# Patient Record
Sex: Male | Born: 1945
Health system: Southern US, Community
[De-identification: ages and names within clinical notes are randomized; demographics above are authoritative.]

## PROBLEM LIST (undated history)

## (undated) DIAGNOSIS — Z9889 Other specified postprocedural states: Secondary | ICD-10-CM

## (undated) DIAGNOSIS — Z87442 Personal history of urinary calculi: Secondary | ICD-10-CM

## (undated) DIAGNOSIS — N402 Nodular prostate without lower urinary tract symptoms: Secondary | ICD-10-CM

## (undated) DIAGNOSIS — T7840XA Allergy, unspecified, initial encounter: Secondary | ICD-10-CM

## (undated) DIAGNOSIS — N400 Enlarged prostate without lower urinary tract symptoms: Secondary | ICD-10-CM

## (undated) DIAGNOSIS — K219 Gastro-esophageal reflux disease without esophagitis: Secondary | ICD-10-CM

## (undated) DIAGNOSIS — N281 Cyst of kidney, acquired: Secondary | ICD-10-CM

## (undated) DIAGNOSIS — C679 Malignant neoplasm of bladder, unspecified: Secondary | ICD-10-CM

## (undated) DIAGNOSIS — E785 Hyperlipidemia, unspecified: Secondary | ICD-10-CM

## (undated) DIAGNOSIS — R351 Nocturia: Secondary | ICD-10-CM

## (undated) DIAGNOSIS — B019 Varicella without complication: Secondary | ICD-10-CM

## (undated) DIAGNOSIS — Z85828 Personal history of other malignant neoplasm of skin: Secondary | ICD-10-CM

## (undated) DIAGNOSIS — E291 Testicular hypofunction: Secondary | ICD-10-CM

## (undated) DIAGNOSIS — N200 Calculus of kidney: Secondary | ICD-10-CM

## (undated) HISTORY — PX: HEMORRHOID BANDING: SHX5850

## (undated) HISTORY — DX: Allergy, unspecified, initial encounter: T78.40XA

## (undated) HISTORY — DX: Gastro-esophageal reflux disease without esophagitis: K21.9

## (undated) HISTORY — PX: TONSILLECTOMY: SUR1361

## (undated) HISTORY — DX: Varicella without complication: B01.9

---

## 1989-06-18 HISTORY — PX: LASIK: SHX215

## 2002-08-31 ENCOUNTER — Encounter (INDEPENDENT_AMBULATORY_CARE_PROVIDER_SITE_OTHER): Payer: Self-pay | Admitting: Specialist

## 2002-08-31 ENCOUNTER — Ambulatory Visit (HOSPITAL_COMMUNITY): Admission: RE | Admit: 2002-08-31 | Discharge: 2002-08-31 | Payer: Self-pay | Admitting: Gastroenterology

## 2003-10-19 HISTORY — PX: OTHER SURGICAL HISTORY: SHX169

## 2005-11-25 ENCOUNTER — Ambulatory Visit: Payer: Self-pay | Admitting: Internal Medicine

## 2005-12-24 ENCOUNTER — Ambulatory Visit: Payer: Self-pay | Admitting: Internal Medicine

## 2009-02-22 ENCOUNTER — Emergency Department (HOSPITAL_COMMUNITY): Admission: EM | Admit: 2009-02-22 | Discharge: 2009-02-22 | Payer: Self-pay | Admitting: Emergency Medicine

## 2009-02-23 ENCOUNTER — Observation Stay (HOSPITAL_COMMUNITY): Admission: EM | Admit: 2009-02-23 | Discharge: 2009-02-25 | Payer: Self-pay | Admitting: Emergency Medicine

## 2009-02-24 ENCOUNTER — Encounter (INDEPENDENT_AMBULATORY_CARE_PROVIDER_SITE_OTHER): Payer: Self-pay | Admitting: Surgery

## 2009-02-24 HISTORY — PX: LAPAROSCOPIC CHOLECYSTECTOMY: SUR755

## 2011-01-26 LAB — CBC
HCT: 35.3 % — ABNORMAL LOW (ref 39.0–52.0)
HCT: 37.8 % — ABNORMAL LOW (ref 39.0–52.0)
HCT: 41.5 % (ref 39.0–52.0)
Hemoglobin: 12.2 g/dL — ABNORMAL LOW (ref 13.0–17.0)
Hemoglobin: 13.1 g/dL (ref 13.0–17.0)
Hemoglobin: 14.6 g/dL (ref 13.0–17.0)
MCHC: 34.8 g/dL (ref 30.0–36.0)
MCHC: 35.1 g/dL (ref 30.0–36.0)
MCV: 93 fL (ref 78.0–100.0)
MCV: 93 fL (ref 78.0–100.0)
Platelets: 185 K/uL (ref 150–400)
RBC: 4.07 MIL/uL — ABNORMAL LOW (ref 4.22–5.81)
RBC: 4.48 MIL/uL (ref 4.22–5.81)
RDW: 12.9 % (ref 11.5–15.5)
RDW: 13.2 % (ref 11.5–15.5)
WBC: 14.4 10*3/uL — ABNORMAL HIGH (ref 4.0–10.5)
WBC: 16.1 K/uL — ABNORMAL HIGH (ref 4.0–10.5)

## 2011-01-26 LAB — DIFFERENTIAL
Basophils Absolute: 0 10*3/uL (ref 0.0–0.1)
Basophils Absolute: 0.1 K/uL (ref 0.0–0.1)
Basophils Relative: 1 % (ref 0–1)
Eosinophils Absolute: 0 10*3/uL (ref 0.0–0.7)
Eosinophils Relative: 0 % (ref 0–5)
Eosinophils Relative: 0 % (ref 0–5)
Lymphocytes Relative: 13 % (ref 12–46)
Lymphocytes Relative: 7 % — ABNORMAL LOW (ref 12–46)
Lymphs Abs: 1.1 10*3/uL (ref 0.7–4.0)
Monocytes Absolute: 0.2 10*3/uL (ref 0.1–1.0)
Monocytes Absolute: 1.2 K/uL — ABNORMAL HIGH (ref 0.1–1.0)
Monocytes Relative: 3 % (ref 3–12)
Monocytes Relative: 8 % (ref 3–12)
Neutro Abs: 13.6 K/uL — ABNORMAL HIGH (ref 1.7–7.7)
Neutrophils Relative %: 85 % — ABNORMAL HIGH (ref 43–77)

## 2011-01-26 LAB — COMPREHENSIVE METABOLIC PANEL WITH GFR
Albumin: 3.5 g/dL (ref 3.5–5.2)
Alkaline Phosphatase: 56 U/L (ref 39–117)
BUN: 16 mg/dL (ref 6–23)
Chloride: 103 meq/L (ref 96–112)
Glucose, Bld: 120 mg/dL — ABNORMAL HIGH (ref 70–99)
Potassium: 3.7 meq/L (ref 3.5–5.1)
Total Bilirubin: 0.9 mg/dL (ref 0.3–1.2)

## 2011-01-26 LAB — COMPREHENSIVE METABOLIC PANEL
ALT: 21 U/L (ref 0–53)
AST: 24 U/L (ref 0–37)
CO2: 27 mEq/L (ref 19–32)
Calcium: 9.9 mg/dL (ref 8.4–10.5)
Creatinine, Ser: 0.96 mg/dL (ref 0.4–1.5)
GFR calc Af Amer: >60 mL/min (ref >60)
GFR calc non Af Amer: >60 mL/min (ref >60)
Sodium: 133 mEq/L — ABNORMAL LOW (ref 135–145)
Total Protein: 5.9 g/dL — ABNORMAL LOW (ref 6.0–8.3)

## 2011-01-26 LAB — BASIC METABOLIC PANEL
CO2: 22 mEq/L (ref 19–32)
GFR calc Af Amer: >60 mL/min (ref >60)
GFR calc non Af Amer: >60 mL/min (ref >60)
Glucose, Bld: 132 mg/dL — ABNORMAL HIGH (ref 70–99)
Potassium: 3.7 mEq/L (ref 3.5–5.1)
Sodium: 138 mEq/L (ref 135–145)

## 2011-01-26 LAB — CARDIAC PANEL(CRET KIN+CKTOT+MB+TROPI)
CK, MB: 4 ng/mL (ref 0.3–4.0)
Relative Index: 2 (ref 0.0–2.5)

## 2011-01-26 LAB — HEPATIC FUNCTION PANEL
ALT: 30 U/L (ref 0–53)
AST: 36 U/L (ref 0–37)
Albumin: 4.2 g/dL (ref 3.5–5.2)
Bilirubin, Direct: <0.1 mg/dL (ref 0.0–0.3)

## 2011-01-26 LAB — LIPASE, BLOOD: Lipase: 18 U/L (ref 11–59)

## 2011-03-02 NOTE — Discharge Summary (Signed)
NAMEPANAYIOTIS, RAINVILLE             ACCOUNT NO.:  [id number]   MEDICAL RECORD NO.:  [id number]          PATIENT TYPE:  INP   LOCATION:  1526                         FACILITY:  [facility]   PHYSICIAN:  Clovis Pu. Cornett, M.D.DATE OF BIRTH:  October 02, 1946   DATE OF ADMISSION:  02/23/2009  DATE OF DISCHARGE:  02/25/2009                               DISCHARGE SUMMARY   ADMITTING DIAGNOSIS:  Acute cholecystitis.   DISCHARGE DIAGNOSIS:  Gangrenous cholecystitis.   PROCEDURE:  Laparoscopic cholecystectomy with cholangiogram.   BRIEF HISTORY:  The patient is a 65 year old male admitted on Feb 23, 2009, with acute cholecystitis.  On Feb 24, 2009, he went to the  operating room for laparoscopic cholecystectomy.   HOSPITAL COURSE:  Please see op note for details.  The patient was  admitted on Feb 23, 2009.  He underwent surgery on Feb 24, 2009, which  consisted of a laparoscopic cholecystectomy with cholangiogram.  He had  gangrenous cholecystitis and a drain was placed.  On postoperative day  #1, he is doing well.  He had no fever or chills.  He was walking the  halls without difficulty and tolerating liquids.  His wounds were clean,  dry, and intact.  His JP output was serous.  He was discharged home on  postop day 1 in satisfactory condition.   DISCHARGE INSTRUCTIONS:  He will follow up next week to have his JP  drain removed in my office.  He will be maintained on Levaquin 500 mg  p.o. daily.  He will be given a script for Vicodin 1 to 2 tabs q.4  p.r.n. pain every 4 hours as needed and follow up next week.  He may  shower.  He will refrain from heavy lifting and driving for 1 week.   CONDITION AT DISCHARGE:  Improved.   DISCHARGE MEDICATIONS:  1. Gemfibrozil 1 tablet daily.  2. Simvastatin 20 mg daily.  3. Pantoprazole 40 mg daily.  4. Metoclopramide 10 mg every 6 hours.  5. Oxycodone as needed.  6. Vicodin 1 or 2 tabs q.4 p.r.n. pain.  7. Levaquin 500 mg p.o. daily.      Thomas A.  Cornett, M.D.  Electronically Signed     TAC/MEDQ  D:  02/25/2009  T:  02/25/2009  Job:  [id number]

## 2011-03-02 NOTE — Op Note (Signed)
Jeffrey Horn, Jeffrey Horn             ACCOUNT NO.:  [id number]   MEDICAL RECORD NO.:  [id number]          PATIENT TYPE:  INP   LOCATION:  1526                         FACILITY:  [facility]   PHYSICIAN:  Clovis Pu. Cornett, M.D.DATE OF BIRTH:  1946/03/23   DATE OF PROCEDURE:  02/24/2009  DATE OF DISCHARGE:                               OPERATIVE REPORT   PREOPERATIVE DIAGNOSIS:  Acute cholecystitis.   POSTOPERATIVE DIAGNOSIS:  Gangrenous cholecystitis.   PROCEDURE:  Laparoscopic cholecystectomy with intraoperative  cholangiogram.   SURGEON:  Dr. Harriette Bouillon.   ASSISTANT:  Dr. Cyndia Bent.   ANESTHESIA:  General endotracheal anesthesia.   ESTIMATED BLOOD LOSS:  100 mL.   DRAIN:  A 19 Blake drain to gallbladder fossa.   SPECIMEN:  Gallbladder with gallstones to pathology.   INDICATIONS FOR PROCEDURE:  The patient is a 65 year old male admitted  last night Dr. Bertram Savin due to abdominal pain and he was found to  have acute cholecystitis.  I was asked to see him today since she was  unavailable for laparoscopic cholecystectomy.  I saw the patient and  discussed the procedure with him in the area.  I described the  complications of bleeding, infection, common duct injury and injury to  the organs.  He understood the above complications.  He understood the  need for surgery and agreed to proceed.   DESCRIPTION OF PROCEDURE:  The patient was brought to the operating room  and placed supine.  After induction of general anesthesia, the abdomen  was prepped and draped in a sterile fashion.  A 1-cm infraumbilical  incision was made.  Dissection was carried down to his fascia.  His  fascia was in the midline with a scalpel.  Kocher clamps were used and I  used my finger to push through the peritoneal lining into the abdominal  cavity.  A pursestring suture of 0 Vicryl was placed and a 12-mm port  was placed under direct vision.  Pneumoperitoneum was created to 15 mmHg  of CO2.   Laparoscope was placed.  He was placed in reversed  Trendelenburg and rolled to his left.  Upon inspection, there was  significant chronic changes around the gallbladder.  An 11-mm subxiphoid  port was placed under direct vision.  Two 5 mm ports were placed, one in  the mid right abdomen and the other in the right lower quadrant.  These  were placed under direct vision.  The gallbladder was identified.  The  omentum was adherent to it.  Upon cutting the omentum away, the  gallbladder showed signs of gangrene.  The gallbladder was grabbed by  its dome.  We then were able to push the omentum away to expose the  infundibulum.  A second grasper was used to grab this.  The dissection  was slowly teased.  We were able to identify the cystic duct entering  the gallbladder and the cystic artery as well.  I went ahead and divided  the cystic artery to better open up the critical angle which I did.  We  then put clips on the gallbladder side of the  cystic duct and made a  small incision in it.  Through a separate stab wound, a Cook  cholangiogram catheter was then placed and held in place by clips.  Intraoperative cholangiogram using fluoroscopy and one half-strength  Hypaque dye was used which showed free flow of contrast down the cystic  duct into the common duct and duodenum.  Contrast then went up the  common hepatic duct into right and left hepatic ducts.  There was some  extravasation from around the clip, but we saw no evidence of stone,  stricture or any obstruction.  At this point, I removed the catheter.  We then triple clipped the cystic duct and divided it.  We then used  cautery.  We used clips on any small branches of the posterior cystic  artery we saw to divide.  The gallbladder was decompressed prior to  this.  We then finished our dissection, removing it from the gallbladder  bed and it was partially intrahepatic.  We then placed in an EndoCatch  bag.  The gallbladder bed was  irrigated.  Bleeding was controlled with  Surgicel.  Through a separate stab incision a 19 Blake drain was placed  in the gallbladder bed since it was quite oozy and inflamed.  This was  brought out through the most right lateral port site and secured to the  skin with a 3-0 nylon.  On final inspection, there was no bleeding in  the gallbladder bed.  We then removed the gallbladder through the  umbilical port and passed it off the field.  We suctioned out any excess  irrigation.  We then removed our ports under direct vision and allowed  the CO2 to escape.  The skin was closed with 4-0 Monocryl and Dermabond.  All final counts of sponges, needles and instruments were found be  correct at this portion of the case.  The patient was then awakened,  extubated and taken to recovery in satisfactory condition.      Thomas A. Cornett, M.D.  Electronically Signed     TAC/MEDQ  D:  02/24/2009  T:  02/24/2009  Job:  [id number]

## 2011-03-02 NOTE — H&P (Signed)
NAMELIONARDO, HAZE             ACCOUNT NO.:  [id number]   MEDICAL RECORD NO.:  [id number]          PATIENT TYPE:  INP   LOCATION:  1526                         FACILITY:  [facility]   PHYSICIAN:  Lennie Muckle, MD      DATE OF BIRTH:  [birth year]   DATE OF ADMISSION:  02/23/2009  DATE OF DISCHARGE:                              HISTORY & PHYSICAL   CHIEF COMPLAINT:  Abdominal pain.   HISTORY OF PRESENT ILLNESS:  Mr. Eakins is a 65 year old male who  apparently came to the emergency department yesterday for onset of  abdominal pain at 3 a.m.  He was evaluated and was diagnosed with reflux  disease and sent home with protonix , Oxycodone, and Myclopramide.  He  states the pain worsened over the course of the day.  He began having  mild nausea.  He had associated fever of 100.6.  He did have chills.  The pain was worse with movement and deep inspiration and cough.  Due to  continued pain, he came back to the emergency department.  He did have  an ultrasound which showed a stone in the neck of the gallbladder.  He  had mild thickened wall, no pericholecystic fluid, and some sludge was  also seen.  He states he has had not had any history of prior.  No  diarrhea.  No history of jaundice.   PAST MEDICAL HISTORY:  Hypercholesterolemia.   FAMILY HISTORY:  Lung cancer.   SOCIAL HISTORY:  No tobacco or alcohol use.  He is married.  He works at  home.  He does exercise.  He runs almost every other day.   ALLERGIES:  No drug allergies.   MEDICATIONS:  Gemfibrozil, simvastatin, Oxycodone.   REVIEW OF SYSTEMS:  Negative.   PHYSICAL EXAMINATION:  GENERAL:  He is a pleasant male, appears his  stated age.  Overall he appears well-developed, well-nourished, in no  acute distress.  VITAL SIGNS:  Temperature 98.4, pulse 78, blood pressure 108/72.  HEENT:  Head is normocephalic.  Sclerae clear.  NECK:  Supple.  No lymphadenopathy.  CHEST:  Clear to auscultation bilaterally.  CARDIOVASCULAR:   Regular rate and rhythm.  ABDOMEN:  Soft.  He is mildly tender to epigastric region.  No  peritoneal signs.  No masses, no organomegaly  SKIN:  No jaundices, no rashes.  EXTREMITIES/MUSCULOSKELETAL:  No deformities or edema.  NEUROLOGIC:  I see no abnormalities.  PSYCHOLOGIC:  Normal.   LABORATORY DATA:  White count is elevated at 15.9, hemoglobin 13.  Serum  chemistries normal.  Liver enzymes normal.  Alkaline phosphatase 56, ALT  24, AST 21.   IMPRESSION/PLAN:  Acute cholecystitis/cholelithiasis.   PLAN:  Admit with IV antibiotics of Zosyn.  Continue his n.p.o. status  Give him Dilaudid for pain control and plan on performing laparoscopic  cholecystectomy in the morning.  The risks of the surgery were explained  to the patient and his family who was present in the room.  All  questions were answered.  He will be monitored tonight.  Repeat CBC in  the morning.  Lennie Muckle, MD  Electronically Signed     ALA/MEDQ  D:  02/23/2009  T:  02/24/2009  Job:  [id number]   cc:   Caryn Bee L. Little, M.D.  Fax: [phone]

## 2011-11-01 DIAGNOSIS — Z125 Encounter for screening for malignant neoplasm of prostate: Secondary | ICD-10-CM | POA: Diagnosis not present

## 2011-11-01 DIAGNOSIS — E7800 Pure hypercholesterolemia, unspecified: Secondary | ICD-10-CM | POA: Diagnosis not present

## 2011-11-01 DIAGNOSIS — Z79899 Other long term (current) drug therapy: Secondary | ICD-10-CM | POA: Diagnosis not present

## 2011-11-01 DIAGNOSIS — E291 Testicular hypofunction: Secondary | ICD-10-CM | POA: Diagnosis not present

## 2011-11-01 DIAGNOSIS — Z0000 Encounter for general adult medical examination without abnormal findings: Secondary | ICD-10-CM | POA: Diagnosis not present

## 2011-11-01 DIAGNOSIS — IMO0001 Reserved for inherently not codable concepts without codable children: Secondary | ICD-10-CM | POA: Diagnosis not present

## 2011-11-01 DIAGNOSIS — L570 Actinic keratosis: Secondary | ICD-10-CM | POA: Diagnosis not present

## 2011-11-01 DIAGNOSIS — N200 Calculus of kidney: Secondary | ICD-10-CM | POA: Diagnosis not present

## 2012-07-06 DIAGNOSIS — Z23 Encounter for immunization: Secondary | ICD-10-CM | POA: Diagnosis not present

## 2012-08-01 DIAGNOSIS — Z0000 Encounter for general adult medical examination without abnormal findings: Secondary | ICD-10-CM | POA: Diagnosis not present

## 2012-08-01 DIAGNOSIS — N402 Nodular prostate without lower urinary tract symptoms: Secondary | ICD-10-CM | POA: Diagnosis not present

## 2012-08-01 DIAGNOSIS — N200 Calculus of kidney: Secondary | ICD-10-CM | POA: Diagnosis not present

## 2012-08-01 DIAGNOSIS — Z79899 Other long term (current) drug therapy: Secondary | ICD-10-CM | POA: Diagnosis not present

## 2012-08-01 DIAGNOSIS — Z125 Encounter for screening for malignant neoplasm of prostate: Secondary | ICD-10-CM | POA: Diagnosis not present

## 2012-08-01 DIAGNOSIS — E291 Testicular hypofunction: Secondary | ICD-10-CM | POA: Diagnosis not present

## 2012-08-01 DIAGNOSIS — E7800 Pure hypercholesterolemia, unspecified: Secondary | ICD-10-CM | POA: Diagnosis not present

## 2012-08-01 DIAGNOSIS — IMO0001 Reserved for inherently not codable concepts without codable children: Secondary | ICD-10-CM | POA: Diagnosis not present

## 2012-11-09 DIAGNOSIS — Z79899 Other long term (current) drug therapy: Secondary | ICD-10-CM | POA: Diagnosis not present

## 2012-11-09 DIAGNOSIS — Z0000 Encounter for general adult medical examination without abnormal findings: Secondary | ICD-10-CM | POA: Diagnosis not present

## 2012-11-09 DIAGNOSIS — Z125 Encounter for screening for malignant neoplasm of prostate: Secondary | ICD-10-CM | POA: Diagnosis not present

## 2012-11-09 DIAGNOSIS — Z1331 Encounter for screening for depression: Secondary | ICD-10-CM | POA: Diagnosis not present

## 2012-11-09 DIAGNOSIS — N200 Calculus of kidney: Secondary | ICD-10-CM | POA: Diagnosis not present

## 2012-11-09 DIAGNOSIS — E7800 Pure hypercholesterolemia, unspecified: Secondary | ICD-10-CM | POA: Diagnosis not present

## 2012-11-09 DIAGNOSIS — L570 Actinic keratosis: Secondary | ICD-10-CM | POA: Diagnosis not present

## 2012-11-09 DIAGNOSIS — E291 Testicular hypofunction: Secondary | ICD-10-CM | POA: Diagnosis not present

## 2012-12-11 DIAGNOSIS — Z1211 Encounter for screening for malignant neoplasm of colon: Secondary | ICD-10-CM | POA: Diagnosis not present

## 2012-12-11 DIAGNOSIS — K648 Other hemorrhoids: Secondary | ICD-10-CM | POA: Diagnosis not present

## 2012-12-27 DIAGNOSIS — K648 Other hemorrhoids: Secondary | ICD-10-CM | POA: Diagnosis not present

## 2013-01-10 DIAGNOSIS — K648 Other hemorrhoids: Secondary | ICD-10-CM | POA: Diagnosis not present

## 2013-01-24 DIAGNOSIS — K648 Other hemorrhoids: Secondary | ICD-10-CM | POA: Diagnosis not present

## 2013-05-08 ENCOUNTER — Other Ambulatory Visit: Payer: Self-pay | Admitting: Dermatology

## 2013-05-08 DIAGNOSIS — L821 Other seborrheic keratosis: Secondary | ICD-10-CM | POA: Diagnosis not present

## 2013-05-08 DIAGNOSIS — C44621 Squamous cell carcinoma of skin of unspecified upper limb, including shoulder: Secondary | ICD-10-CM | POA: Diagnosis not present

## 2013-05-08 DIAGNOSIS — L819 Disorder of pigmentation, unspecified: Secondary | ICD-10-CM | POA: Diagnosis not present

## 2013-05-08 DIAGNOSIS — D235 Other benign neoplasm of skin of trunk: Secondary | ICD-10-CM | POA: Diagnosis not present

## 2013-05-08 DIAGNOSIS — L570 Actinic keratosis: Secondary | ICD-10-CM | POA: Diagnosis not present

## 2013-06-19 DIAGNOSIS — Z85828 Personal history of other malignant neoplasm of skin: Secondary | ICD-10-CM | POA: Diagnosis not present

## 2013-06-28 DIAGNOSIS — Z23 Encounter for immunization: Secondary | ICD-10-CM | POA: Diagnosis not present

## 2013-08-02 DIAGNOSIS — E7800 Pure hypercholesterolemia, unspecified: Secondary | ICD-10-CM | POA: Diagnosis not present

## 2013-09-17 DIAGNOSIS — Z85828 Personal history of other malignant neoplasm of skin: Secondary | ICD-10-CM | POA: Diagnosis not present

## 2013-10-05 ENCOUNTER — Other Ambulatory Visit: Payer: Self-pay | Admitting: Family Medicine

## 2013-10-05 ENCOUNTER — Ambulatory Visit
Admission: RE | Admit: 2013-10-05 | Discharge: 2013-10-05 | Disposition: A | Discharge status: Home / Self Care | Payer: Medicare Other | Source: Ambulatory Visit | Attending: Family Medicine | Admitting: Family Medicine

## 2013-10-05 DIAGNOSIS — N200 Calculus of kidney: Secondary | ICD-10-CM | POA: Diagnosis not present

## 2013-10-05 DIAGNOSIS — R319 Hematuria, unspecified: Secondary | ICD-10-CM | POA: Diagnosis not present

## 2013-10-30 DIAGNOSIS — N281 Cyst of kidney, acquired: Secondary | ICD-10-CM | POA: Diagnosis not present

## 2013-10-30 DIAGNOSIS — N200 Calculus of kidney: Secondary | ICD-10-CM | POA: Diagnosis not present

## 2013-10-30 DIAGNOSIS — R310 Gross hematuria: Secondary | ICD-10-CM | POA: Diagnosis not present

## 2013-10-30 DIAGNOSIS — N402 Nodular prostate without lower urinary tract symptoms: Secondary | ICD-10-CM | POA: Diagnosis not present

## 2013-11-06 DIAGNOSIS — N200 Calculus of kidney: Secondary | ICD-10-CM | POA: Diagnosis not present

## 2013-11-06 DIAGNOSIS — R310 Gross hematuria: Secondary | ICD-10-CM | POA: Diagnosis not present

## 2013-11-06 DIAGNOSIS — C675 Malignant neoplasm of bladder neck: Secondary | ICD-10-CM | POA: Diagnosis not present

## 2013-11-08 DIAGNOSIS — R310 Gross hematuria: Secondary | ICD-10-CM | POA: Diagnosis not present

## 2013-11-08 DIAGNOSIS — N200 Calculus of kidney: Secondary | ICD-10-CM | POA: Diagnosis not present

## 2013-11-13 ENCOUNTER — Other Ambulatory Visit: Payer: Self-pay | Admitting: Urology

## 2013-11-15 DIAGNOSIS — Z1331 Encounter for screening for depression: Secondary | ICD-10-CM | POA: Diagnosis not present

## 2013-11-15 DIAGNOSIS — N402 Nodular prostate without lower urinary tract symptoms: Secondary | ICD-10-CM | POA: Diagnosis not present

## 2013-11-15 DIAGNOSIS — Z79899 Other long term (current) drug therapy: Secondary | ICD-10-CM | POA: Diagnosis not present

## 2013-11-15 DIAGNOSIS — E291 Testicular hypofunction: Secondary | ICD-10-CM | POA: Diagnosis not present

## 2013-11-15 DIAGNOSIS — Z23 Encounter for immunization: Secondary | ICD-10-CM | POA: Diagnosis not present

## 2013-11-15 DIAGNOSIS — N200 Calculus of kidney: Secondary | ICD-10-CM | POA: Diagnosis not present

## 2013-11-15 DIAGNOSIS — Z0000 Encounter for general adult medical examination without abnormal findings: Secondary | ICD-10-CM | POA: Diagnosis not present

## 2013-11-15 DIAGNOSIS — E7800 Pure hypercholesterolemia, unspecified: Secondary | ICD-10-CM | POA: Diagnosis not present

## 2013-11-26 ENCOUNTER — Encounter (HOSPITAL_COMMUNITY): Payer: Self-pay | Admitting: Pharmacy Technician

## 2013-11-28 ENCOUNTER — Encounter (HOSPITAL_COMMUNITY): Payer: Self-pay

## 2013-11-28 ENCOUNTER — Encounter (HOSPITAL_COMMUNITY)
Admission: RE | Admit: 2013-11-28 | Discharge: 2013-11-28 | Disposition: A | Discharge status: Home / Self Care | Payer: Medicare Other | Source: Ambulatory Visit | Attending: Urology | Admitting: Urology

## 2013-11-28 ENCOUNTER — Encounter (INDEPENDENT_AMBULATORY_CARE_PROVIDER_SITE_OTHER): Payer: Self-pay

## 2013-11-28 DIAGNOSIS — Z01818 Encounter for other preprocedural examination: Secondary | ICD-10-CM | POA: Insufficient documentation

## 2013-11-28 HISTORY — DX: Benign prostatic hyperplasia without lower urinary tract symptoms: N40.0

## 2013-11-28 HISTORY — DX: Hyperlipidemia, unspecified: E78.5

## 2013-11-28 HISTORY — DX: Personal history of urinary calculi: Z87.442

## 2013-11-28 NOTE — Pre-Procedure Instructions (Signed)
PT'S CBC, DIFF AND CMET REPORTS AND OFFICE VISIT NOTES FROM 11-15-13 DR. K. LITTLE ON CHART.  NO ADDITIONAL LABS, CXR OR EKG NEEDED PER ANESTHESIOLOGIST'S GUIDELINES.

## 2013-11-28 NOTE — Patient Instructions (Signed)
  YOUR SURGERY IS SCHEDULED AT [facility]  ON:     Monday  2/16  REPORT TO  SHORT STAY CENTER AT:   8:15 AM      PHONE # FOR SHORT STAY IS [phone]  DO NOT EAT OR DRINK ANYTHING AFTER MIDNIGHT THE NIGHT BEFORE YOUR SURGERY.  YOU MAY BRUSH YOUR TEETH, RINSE OUT YOUR MOUTH--BUT NO WATER, NO FOOD, NO CHEWING GUM, NO MINTS, NO CANDIES, NO CHEWING TOBACCO.  PLEASE TAKE THE FOLLOWING MEDICATIONS THE AM OF YOUR SURGERY WITH A FEW SIPS OF WATER: NO MEDS TO TAKE.   DO NOT BRING VALUABLES, MONEY, CREDIT CARDS.  DO NOT WEAR JEWELRY, MAKE-UP, NAIL POLISH AND NO METAL PINS OR CLIPS IN YOUR HAIR. CONTACT LENS, DENTURES / PARTIALS, GLASSES SHOULD NOT BE WORN TO SURGERY AND IN MOST CASES-HEARING AIDS WILL NEED TO BE REMOVED.  BRING YOUR GLASSES CASE, ANY EQUIPMENT NEEDED FOR YOUR CONTACT LENS. FOR PATIENTS ADMITTED TO THE HOSPITAL--CHECK OUT TIME THE DAY OF DISCHARGE IS 11:00 AM.  ALL INPATIENT ROOMS ARE PRIVATE - WITH BATHROOM, TELEPHONE, TELEVISION AND WIFI INTERNET.                                             FAILURE TO FOLLOW THESE INSTRUCTIONS MAY RESULT IN THE CANCELLATION OF YOUR SURGERY. PLEASE BE AWARE THAT YOU MAY NEED ADDITIONAL BLOOD DRAWN DAY OF YOUR SURGERY  PATIENT SIGNATURE_________________________________

## 2013-12-03 ENCOUNTER — Observation Stay (HOSPITAL_COMMUNITY)
Admission: RE | Admit: 2013-12-03 | Discharge: 2013-12-05 | Disposition: A | Discharge status: Home / Self Care | Payer: Medicare Other | Source: Ambulatory Visit | Attending: Urology | Admitting: Urology

## 2013-12-03 ENCOUNTER — Encounter (HOSPITAL_COMMUNITY): Payer: Medicare Other | Admitting: Registered Nurse

## 2013-12-03 ENCOUNTER — Encounter (HOSPITAL_COMMUNITY)
Admission: RE | Disposition: A | Discharge status: Home / Self Care | Payer: Self-pay | Source: Ambulatory Visit | Attending: Urology

## 2013-12-03 ENCOUNTER — Encounter (HOSPITAL_COMMUNITY): Payer: Self-pay | Admitting: Registered Nurse

## 2013-12-03 ENCOUNTER — Inpatient Hospital Stay (HOSPITAL_COMMUNITY): Payer: Medicare Other | Admitting: Registered Nurse

## 2013-12-03 DIAGNOSIS — N398 Other specified disorders of urinary system: Secondary | ICD-10-CM | POA: Diagnosis not present

## 2013-12-03 DIAGNOSIS — E785 Hyperlipidemia, unspecified: Secondary | ICD-10-CM | POA: Diagnosis not present

## 2013-12-03 DIAGNOSIS — N400 Enlarged prostate without lower urinary tract symptoms: Secondary | ICD-10-CM | POA: Diagnosis not present

## 2013-12-03 DIAGNOSIS — IMO0001 Reserved for inherently not codable concepts without codable children: Secondary | ICD-10-CM | POA: Diagnosis not present

## 2013-12-03 DIAGNOSIS — N401 Enlarged prostate with lower urinary tract symptoms: Secondary | ICD-10-CM | POA: Insufficient documentation

## 2013-12-03 DIAGNOSIS — N281 Cyst of kidney, acquired: Secondary | ICD-10-CM | POA: Diagnosis not present

## 2013-12-03 DIAGNOSIS — N402 Nodular prostate without lower urinary tract symptoms: Secondary | ICD-10-CM | POA: Diagnosis not present

## 2013-12-03 DIAGNOSIS — N200 Calculus of kidney: Secondary | ICD-10-CM | POA: Diagnosis not present

## 2013-12-03 DIAGNOSIS — Z888 Allergy status to other drugs, medicaments and biological substances status: Secondary | ICD-10-CM | POA: Insufficient documentation

## 2013-12-03 DIAGNOSIS — R310 Gross hematuria: Secondary | ICD-10-CM | POA: Insufficient documentation

## 2013-12-03 DIAGNOSIS — E291 Testicular hypofunction: Secondary | ICD-10-CM | POA: Insufficient documentation

## 2013-12-03 DIAGNOSIS — R300 Dysuria: Secondary | ICD-10-CM | POA: Diagnosis not present

## 2013-12-03 DIAGNOSIS — Z79899 Other long term (current) drug therapy: Secondary | ICD-10-CM | POA: Diagnosis not present

## 2013-12-03 DIAGNOSIS — Z9089 Acquired absence of other organs: Secondary | ICD-10-CM | POA: Insufficient documentation

## 2013-12-03 DIAGNOSIS — C679 Malignant neoplasm of bladder, unspecified: Principal | ICD-10-CM | POA: Diagnosis present

## 2013-12-03 DIAGNOSIS — Z85828 Personal history of other malignant neoplasm of skin: Secondary | ICD-10-CM | POA: Insufficient documentation

## 2013-12-03 DIAGNOSIS — N138 Other obstructive and reflux uropathy: Secondary | ICD-10-CM | POA: Insufficient documentation

## 2013-12-03 DIAGNOSIS — Z882 Allergy status to sulfonamides status: Secondary | ICD-10-CM | POA: Diagnosis not present

## 2013-12-03 DIAGNOSIS — D494 Neoplasm of unspecified behavior of bladder: Secondary | ICD-10-CM | POA: Diagnosis not present

## 2013-12-03 HISTORY — PX: TRANSURETHRAL RESECTION OF BLADDER TUMOR: SHX2575

## 2013-12-03 SURGERY — TURBT (TRANSURETHRAL RESECTION OF BLADDER TUMOR)
Anesthesia: General

## 2013-12-03 MED ORDER — METHYLENE BLUE 1 % INJ SOLN
INTRAMUSCULAR | Status: DC | PRN
  Administered 2013-12-03: 5 mL via INTRAVENOUS

## 2013-12-03 MED ORDER — METHYLENE BLUE 1 % INJ SOLN
INTRAMUSCULAR | Status: AC
  Filled 2013-12-03: qty 10

## 2013-12-03 MED ORDER — SIMVASTATIN 20 MG PO TABS
20.0000 mg | ORAL_TABLET | Freq: Every day | ORAL | Status: DC
  Administered 2013-12-03 – 2013-12-04 (×2): 20 mg via ORAL
  Filled 2013-12-03 (×3): qty 1

## 2013-12-03 MED ORDER — SODIUM CHLORIDE 0.45 % IV SOLN
INTRAVENOUS | Status: DC
  Administered 2013-12-03 – 2013-12-04 (×2): via INTRAVENOUS

## 2013-12-03 MED ORDER — HYDROMORPHONE HCL PF 1 MG/ML IJ SOLN
0.2500 mg | INTRAMUSCULAR | Status: DC | PRN
  Administered 2013-12-03 (×2): 0.25 mg via INTRAVENOUS
  Administered 2013-12-03: 0.5 mg via INTRAVENOUS

## 2013-12-03 MED ORDER — DEXAMETHASONE SODIUM PHOSPHATE 10 MG/ML IJ SOLN
INTRAMUSCULAR | Status: AC
  Filled 2013-12-03: qty 1

## 2013-12-03 MED ORDER — LIDOCAINE HCL (CARDIAC) 20 MG/ML IV SOLN
INTRAVENOUS | Status: AC
  Filled 2013-12-03: qty 5

## 2013-12-03 MED ORDER — SODIUM CHLORIDE 0.9 % IR SOLN
Status: DC | PRN
  Administered 2013-12-03: 9000 mL

## 2013-12-03 MED ORDER — ACETAMINOPHEN 10 MG/ML IV SOLN
1000.0000 mg | Freq: Once | INTRAVENOUS | Status: AC
  Administered 2013-12-03: 1000 mg via INTRAVENOUS
  Filled 2013-12-03: qty 100

## 2013-12-03 MED ORDER — HYDROCODONE-ACETAMINOPHEN 5-325 MG PO TABS: 1.0000 | ORAL_TABLET | ORAL | Status: DC | PRN

## 2013-12-03 MED ORDER — ONDANSETRON HCL 4 MG/2ML IJ SOLN
INTRAMUSCULAR | Status: AC
  Filled 2013-12-03: qty 2

## 2013-12-03 MED ORDER — EPHEDRINE SULFATE 50 MG/ML IJ SOLN
INTRAMUSCULAR | Status: DC | PRN
  Administered 2013-12-03: 5 mg via INTRAVENOUS
  Administered 2013-12-03: 10 mg via INTRAVENOUS

## 2013-12-03 MED ORDER — BACITRACIN-NEOMYCIN-POLYMYXIN 400-5-5000 EX OINT: 1.0000 "application " | TOPICAL_OINTMENT | Freq: Three times a day (TID) | CUTANEOUS | Status: DC | PRN

## 2013-12-03 MED ORDER — CIPROFLOXACIN HCL 500 MG PO TABS
500.0000 mg | ORAL_TABLET | Freq: Two times a day (BID) | ORAL | Status: DC
  Administered 2013-12-03 – 2013-12-05 (×4): 500 mg via ORAL
  Filled 2013-12-03 (×6): qty 1

## 2013-12-03 MED ORDER — ONDANSETRON HCL 4 MG/2ML IJ SOLN
4.0000 mg | INTRAMUSCULAR | Status: DC | PRN
Start: 2013-12-03 — End: 2013-12-05

## 2013-12-03 MED ORDER — FENTANYL CITRATE 0.05 MG/ML IJ SOLN
INTRAMUSCULAR | Status: AC
  Filled 2013-12-03: qty 5

## 2013-12-03 MED ORDER — SODIUM CHLORIDE 0.9 % IR SOLN
3000.0000 mL | Status: DC
  Administered 2013-12-03: 3000 mL

## 2013-12-03 MED ORDER — PROPOFOL 10 MG/ML IV BOLUS
INTRAVENOUS | Status: AC
  Filled 2013-12-03: qty 20

## 2013-12-03 MED ORDER — LIDOCAINE HCL (CARDIAC) 10 MG/ML IV SOLN
INTRAVENOUS | Status: DC | PRN
  Administered 2013-12-03: 100 mg via INTRAVENOUS

## 2013-12-03 MED ORDER — DIPHENHYDRAMINE HCL 12.5 MG/5ML PO ELIX: 12.5000 mg | ORAL_SOLUTION | Freq: Four times a day (QID) | ORAL | Status: DC | PRN

## 2013-12-03 MED ORDER — HYDROMORPHONE HCL PF 1 MG/ML IJ SOLN
INTRAMUSCULAR | Status: AC
  Filled 2013-12-03: qty 1

## 2013-12-03 MED ORDER — DIPHENHYDRAMINE HCL 50 MG/ML IJ SOLN: 12.5000 mg | Freq: Four times a day (QID) | INTRAMUSCULAR | Status: DC | PRN

## 2013-12-03 MED ORDER — BELLADONNA ALKALOIDS-OPIUM 16.2-60 MG RE SUPP
RECTAL | Status: AC
  Filled 2013-12-03: qty 1

## 2013-12-03 MED ORDER — PROMETHAZINE HCL 25 MG/ML IJ SOLN: 6.2500 mg | INTRAMUSCULAR | Status: DC | PRN

## 2013-12-03 MED ORDER — PROPOFOL 10 MG/ML IV BOLUS
INTRAVENOUS | Status: DC | PRN
  Administered 2013-12-03: 200 mg via INTRAVENOUS

## 2013-12-03 MED ORDER — CEFAZOLIN SODIUM-DEXTROSE 2-3 GM-% IV SOLR
INTRAVENOUS | Status: AC
  Filled 2013-12-03: qty 50

## 2013-12-03 MED ORDER — 0.9 % SODIUM CHLORIDE (POUR BTL) OPTIME
TOPICAL | Status: DC | PRN
  Administered 2013-12-03: 1000 mL

## 2013-12-03 MED ORDER — KETOROLAC TROMETHAMINE 15 MG/ML IJ SOLN
30.0000 mg | Freq: Once | INTRAMUSCULAR | Status: AC
  Administered 2013-12-03: 30 mg via INTRAVENOUS
  Filled 2013-12-03: qty 2

## 2013-12-03 MED ORDER — MIDAZOLAM HCL 2 MG/2ML IJ SOLN
INTRAMUSCULAR | Status: AC
  Filled 2013-12-03: qty 2

## 2013-12-03 MED ORDER — FENTANYL CITRATE 0.05 MG/ML IJ SOLN
INTRAMUSCULAR | Status: DC | PRN
  Administered 2013-12-03: 100 ug via INTRAVENOUS
  Administered 2013-12-03 (×3): 50 ug via INTRAVENOUS

## 2013-12-03 MED ORDER — BELLADONNA ALKALOIDS-OPIUM 16.2-60 MG RE SUPP
RECTAL | Status: DC | PRN
  Administered 2013-12-03: 1 via RECTAL

## 2013-12-03 MED ORDER — OXYCODONE HCL 5 MG/5ML PO SOLN
5.0000 mg | Freq: Once | ORAL | Status: DC | PRN
  Filled 2013-12-03: qty 5

## 2013-12-03 MED ORDER — ONDANSETRON HCL 4 MG/2ML IJ SOLN
INTRAMUSCULAR | Status: DC | PRN
  Administered 2013-12-03: 4 mg via INTRAVENOUS

## 2013-12-03 MED ORDER — HYDROMORPHONE HCL PF 1 MG/ML IJ SOLN: 0.5000 mg | INTRAMUSCULAR | Status: DC | PRN

## 2013-12-03 MED ORDER — PHENYLEPHRINE HCL 10 MG/ML IJ SOLN
INTRAMUSCULAR | Status: DC | PRN
  Administered 2013-12-03: 80 ug via INTRAVENOUS

## 2013-12-03 MED ORDER — SENNA 8.6 MG PO TABS
1.0000 | ORAL_TABLET | Freq: Two times a day (BID) | ORAL | Status: DC
  Administered 2013-12-03 – 2013-12-05 (×4): 8.6 mg via ORAL
  Filled 2013-12-03 (×4): qty 1

## 2013-12-03 MED ORDER — LACTATED RINGERS IV SOLN
INTRAVENOUS | Status: DC
  Administered 2013-12-03: 1000 mL via INTRAVENOUS
  Administered 2013-12-03: 11:00:00 via INTRAVENOUS

## 2013-12-03 MED ORDER — CEFAZOLIN SODIUM-DEXTROSE 2-3 GM-% IV SOLR
2.0000 g | INTRAVENOUS | Status: AC
  Administered 2013-12-03: 2 g via INTRAVENOUS

## 2013-12-03 MED ORDER — ADULT MULTIVITAMIN W/MINERALS CH
1.0000 | ORAL_TABLET | Freq: Every day | ORAL | Status: DC
  Administered 2013-12-03 – 2013-12-05 (×3): 1 via ORAL
  Filled 2013-12-03 (×3): qty 1

## 2013-12-03 MED ORDER — MIDAZOLAM HCL 5 MG/5ML IJ SOLN
INTRAMUSCULAR | Status: DC | PRN
  Administered 2013-12-03: 2 mg via INTRAVENOUS
  Administered 2013-12-03: 1 mg via INTRAVENOUS

## 2013-12-03 MED ORDER — OXYCODONE HCL 5 MG PO TABS: 5.0000 mg | ORAL_TABLET | Freq: Once | ORAL | Status: DC | PRN

## 2013-12-03 MED ORDER — MEPERIDINE HCL 50 MG/ML IJ SOLN: 6.2500 mg | INTRAMUSCULAR | Status: DC | PRN

## 2013-12-03 MED ORDER — OXYBUTYNIN CHLORIDE 5 MG PO TABS
5.0000 mg | ORAL_TABLET | Freq: Three times a day (TID) | ORAL | Status: DC | PRN
  Administered 2013-12-03: 5 mg via ORAL
  Filled 2013-12-03: qty 1

## 2013-12-03 MED ORDER — OXYCODONE-ACETAMINOPHEN 5-325 MG PO TABS
2.0000 | ORAL_TABLET | ORAL | Status: DC | PRN
  Administered 2013-12-04 (×2): 2 via ORAL
  Filled 2013-12-03 (×2): qty 2

## 2013-12-03 SURGICAL SUPPLY — 21 items
BAG URINE DRAINAGE (UROLOGICAL SUPPLIES) ×1 IMPLANT
BAG URO CATCHER STRL LF (DRAPE) ×2 IMPLANT
CATH HEMA 3WAY 30CC 22FR COUDE (CATHETERS) ×1 IMPLANT
DRAPE CAMERA CLOSED 9X96 (DRAPES) ×2 IMPLANT
ELECT BUTTON HF 24-28F 2 30DE (ELECTRODE) ×1 IMPLANT
ELECT LOOP MED HF 24F 12D (CUTTING LOOP) ×2 IMPLANT
ELECT LOOP MED HF 24F 12D CBL (CLIP) ×1 IMPLANT
ELECT RESECT VAPORIZE 12D CBL (ELECTRODE) ×1 IMPLANT
GLOVE BIOGEL M STRL SZ7.5 (GLOVE) ×2 IMPLANT
GOWN STRL REUS W/TWL LRG LVL3 (GOWN DISPOSABLE) ×2 IMPLANT
GOWN STRL REUS W/TWL XL LVL3 (GOWN DISPOSABLE) ×2 IMPLANT
HOLDER FOLEY CATH W/STRAP (MISCELLANEOUS) ×1 IMPLANT
IV NS IRRIG 3000ML ARTHROMATIC (IV SOLUTION) ×4 IMPLANT
KIT ASPIRATION TUBING (SET/KITS/TRAYS/PACK) ×2 IMPLANT
MANIFOLD NEPTUNE II (INSTRUMENTS) ×2 IMPLANT
NS IRRIG 1000ML POUR BTL (IV SOLUTION) ×2 IMPLANT
PACK CYSTO (CUSTOM PROCEDURE TRAY) ×2 IMPLANT
SCRUB PCMX 4 OZ (MISCELLANEOUS) ×1 IMPLANT
SET IRRIG Y TYPE TUR BLADDER L (SET/KITS/TRAYS/PACK) ×1 IMPLANT
SYRINGE IRR TOOMEY STRL 70CC (SYRINGE) ×1 IMPLANT
TUBING CONNECTING 10 (TUBING) ×2 IMPLANT

## 2013-12-03 NOTE — Anesthesia Postprocedure Evaluation (Signed)
Anesthesia Post Note  Patient: Jeffrey Horn  Procedure(s) Performed: Procedure(s) (LRB): TRANSURETHRAL RESECTION OF BLADDER TUMOR (TURBT)AT BLADDER NECK 8CM, TRANSURETHRAL RESECTION OF PROSTATE (N/A)  Anesthesia type: General  Patient location: PACU  Post pain: Pain level controlled  Post assessment: Post-op Vital signs reviewed  Last Vitals: BP 107/69  Pulse 94  Temp(Src) 36.4 C (Oral)  Resp 14  SpO2 97%  Post vital signs: Reviewed  Level of consciousness: sedated  Complications: No apparent anesthesia complications

## 2013-12-03 NOTE — H&P (Signed)
Active Problems Problems  1. Bilateral kidney stones (592.0) 2. Gross hematuria (599.71) 3. Nodular prostate (600.10) 4. Prostate Hard Area Or Nodule On The Right 5. Renal cyst, acquired (593.2)  History of Present Illness     68 yo married male returns today for a Renal u/s & cystoscopy for hx of gross hematuria. Originally referred by Dr. Rex Kras for further evaluation of asymptomatic gross hematuria & passing blood clots. He states that this happened on 10/05/13 & lasted x 5 days. His urine has since been clear. IPSS= 1 only.       He had a CT on 10/05/13 that showed 6.7cm low density lesion on the Rt renal lower pole, that mostly likely is a renal cyst. It also showed that he had bilateral kidney stones. Stones in the Lt kidney are larger than the Rt.      Low dose ASA, but off since Dec 17th. No tobacco (neg history). Employed as Games developer.   Past Medical History Problems  1. History of BPH (benign prostatic hypertrophy) (600.00) 2. History of fibromyalgia (V13.59) 3. History of hyperlipidemia (V12.29) 4. History of malignant neoplasm of skin (V10.83) 5. History of renal calculi (V13.01) 6. History of Low testosterone (257.2)  Surgical History Problems  1. History of Cholecystectomy 2. History of Eye Surgery 3. History of Hemorrhoidectomy  Current Meds 1. AndroGel 50 MG/5GM Transdermal Gel;  Therapy: [id number] to Recorded 2. Lovastatin 10 MG Oral Tablet;  Therapy: [id number] to Recorded  Allergies Medication  1. Sulfa Drugs 2. Lovastatin TABS 3. Pravastatin Sodium TABS  Family History Problems  1. Family history of lung cancer (V16.1) : Father  Social History Problems  1. Alcohol use   2 per day 2. Caffeine use (V49.89)   6-8 per day 3. Father deceased 32. Married 5. Mother deceased 71. Never a smoker (V49.89) 7. Occupation   Games developer 8. Three children   daughters  Review of Systems Genitourinary, constitutional, skin,  eye, otolaryngeal, hematologic/lymphatic, cardiovascular, pulmonary, endocrine, musculoskeletal, gastrointestinal, neurological and psychiatric system(s) were reviewed and pertinent findings if present are noted.  Genitourinary: nocturia, hematuria and erectile dysfunction, but no urinary frequency, no feelings of urinary urgency, no difficulty starting the urinary stream, urine stream is not weak, urinary stream does not start and stop, no incomplete emptying of bladder and initiating urination does not require straining.  Gastrointestinal: no nausea, no vomiting, no heartburn, no diarrhea and no constipation.  Constitutional: no fever, no night sweats, not feeling tired (fatigue) and no recent weight loss.  Integumentary: no new skin rashes or lesions and no pruritus.  Eyes: no blurred vision and no diplopia.  ENT: no sore throat and no sinus problems.  Hematologic/Lymphatic: no tendency to easily bruise and no swollen glands.  Cardiovascular: no chest pain and no leg swelling.  Respiratory: no shortness of breath and no cough.  Endocrine: no polydipsia.  Musculoskeletal: joint pain, but no back pain.  Neurological: no headache and no dizziness.  Psychiatric: no anxiety and no depression.    Vitals Vital Signs [Data Includes: Last 1 Day]  Recorded: 20Jan2015 09:20AM  Blood Pressure: 122 / 86 Temperature: 98.5 F Heart Rate: 68  Physical Exam Constitutional: Well nourished and well developed . No acute distress.  ENT:. The ears and nose are normal in appearance.  Neck: The appearance of the neck is normal and no neck mass is present.  Pulmonary: No respiratory distress and normal respiratory rhythm and effort.  Cardiovascular: Heart rate and rhythm are normal .  No peripheral edema.  Abdomen: The abdomen is soft and nontender. No masses are palpated. No CVA tenderness. No hernias are palpable. No hepatosplenomegaly noted.  Rectal: Rectal exam demonstrates normal sphincter tone, no  tenderness and no masses. Estimated prostate size is 3+. The prostate has a palpable nodule involving the right, mid aspect of the prostate which appears to be confined within the prostate capsule and is not tender. The left seminal vesicle is nonpalpable. The right seminal vesicle is nonpalpable. The perineum is normal on inspection.  Genitourinary: Examination of the penis demonstrates no discharge, no masses, no lesions and a normal meatus. The scrotum is without lesions. The right epididymis is palpably normal and non-tender. The left epididymis is palpably normal and non-tender. The right testis is non-tender and without masses. The left testis is non-tender and without masses.  Lymphatics: The femoral and inguinal nodes are not enlarged or tender.  Skin: Normal skin turgor, no visible rash and no visible skin lesions.  Neuro/Psych:. Mood and affect are appropriate.    Results/Data Urine [Data Includes: Last 1 Day]   [id number]  COLOR YELLOW   APPEARANCE CLEAR   SPECIFIC GRAVITY 1.025   pH 6.0   GLUCOSE NEG mg/dL  BILIRUBIN NEG   KETONE NEG mg/dL  BLOOD NEG   PROTEIN NEG mg/dL  UROBILINOGEN 0.2 mg/dL  NITRITE NEG   LEUKOCYTE ESTERASE NEG    PVR: Ultrasound PVR 24.13 ml.    Procedure Renal u/s today: Rt kidney - 10.0 x 1.52 x 5.25 x 4.93cm. Rt lower renal cystic area - 6.08 x 5.43 x 4.93cm. Lt kidney - 10.36 x 1.26 x 5.30 x 5.63cm. Multiple probable stones: 0.75cm, 0.78cm & 0.58cm. Questionable Lt lower pole scar - 0.98cm.   Procedure: Cystoscopy  Chaperone Present: kim lewis.  Indication: Hematuria.  Informed Consent: Risks, benefits, and potential adverse events were discussed and informed consent was obtained from the patient.  Prep: The patient was prepped with betadine.  Anesthesia:. Local anesthesia was administered intraurethrally with 2% lidocaine jelly.  Antibiotic prophylaxis: Ciprofloxacin.  Procedure Note:  Urethral meatus:. No abnormalities.  Anterior urethra: No  abnormalities.  Prostatic urethra:. The lateral prostatic lobes were enlarged. No intravesical median lobe was visualized.  Bladder: Visulization was clear. The ureteral orifices were in the normal anatomic position bilaterally. A systematic survey of the bladder demonstrated no bladder tumors or stones. Multiple tumors were identified in the bladder. A papillary tumor was seen in the bladder measuring approximately 3 cm in size. This tumor was located on the left side, on the anterior aspect, at the neck of the bladder. Another papillary tumor was seen in the bladder measuring approximately 2 cm in size. This tumor was located on the anterior aspect, at the neck of the bladder. An additional papillary tumor was seen in the bladder measuring approximately 2 cm in size. This tumor was located on the anterior aspect, at the neck of the bladder.    Assessment Assessed  1. Gross hematuria (599.71) 2. Bladder cancer (188.9) 3. Left nephrolithiasis (592.0)  3 bladder tumors identified with the flex scope within the L side of the bladder neck. When looking at the bladder neck, I can see the tim of one of the tumors on the L side of the bladder neck. I have explained this to the patient and shown him the tumors on the video screen. he will need TUR of the tumors and probably of the L side of the prostate also. I have also discussed this  with his wife.   Plan  Bladder mass, Gross hematuria  1. Follow-up Schedule Surgery Office  Follow-up  Status: Hold For - Appointment   Requested for: 20Jan2015 Gross hematuria  2. AU CT-HEMATURIA PROTOCOL; Status:Hold For - Appointment,PreCert,Date of  Service,Print; Requested BJS:[id number];   1. CT hematuria protocol for delineation of the ureter and the bladder.,  2. TURBT and probably TUR of the L lateral lobe of the prostate.    UA With REFLEX; [Do Not Release]; Status:Complete;  Done: [id number] 12:00AM  Due:22Jan2015; Marked Important; Last Updated IR:SWNIOE,  Diane; 11/06/2013 8:31:42 AM;Ordered; Today;   VOJ:[id number] Maintenance; Ordered HW:[id number], [place];   Discussion/Summary cc: Hulan Fess, MD     Signatures Electronically signed by : Carolan Clines, M.D.; Nov 06 2013  5:58PM EST

## 2013-12-03 NOTE — Anesthesia Preprocedure Evaluation (Addendum)
Anesthesia Evaluation  Patient identified by MRN, date of birth, ID band Patient awake    Reviewed: Allergy & Precautions, H&P , NPO status , Patient's Chart, lab work & pertinent test results  History of Anesthesia Complications Negative for: history of anesthetic complications  Airway Mallampati: II TM Distance: >3 FB Neck ROM: Full    Dental  (+) Dental Advisory Given   Pulmonary pneumonia -, resolved,          Cardiovascular negative cardio ROS  Rhythm:Regular Rate:Normal     Neuro/Psych negative neurological ROS  negative psych ROS   GI/Hepatic negative GI ROS, Neg liver ROS,   Endo/Other  negative endocrine ROS  Renal/GU negative Renal ROS     Musculoskeletal negative musculoskeletal ROS (+)   Abdominal   Peds  Hematology negative hematology ROS (+)   Anesthesia Other Findings   Reproductive/Obstetrics negative OB ROS                           Anesthesia Physical Anesthesia Plan  ASA: II  Anesthesia Plan: General   Post-op Pain Management:    Induction: Intravenous  Airway Management Planned: LMA  Additional Equipment:   Intra-op Plan:   Post-operative Plan: Extubation in OR  Informed Consent: I have reviewed the patients History and Physical, chart, labs and discussed the procedure including the risks, benefits and alternatives for the proposed anesthesia with the patient or authorized representative who has indicated his/her understanding and acceptance.   Dental advisory given  Plan Discussed with: CRNA  Anesthesia Plan Comments:         Anesthesia Quick Evaluation

## 2013-12-03 NOTE — Interval H&P Note (Signed)
History and Physical Interval Note:  12/03/2013 8:11 AM  Jeffrey Horn  has presented today for surgery, with the diagnosis of Left Bladder Mass, Bladder Tumors  The various methods of treatment have been discussed with the patient and family. After consideration of risks, benefits and other options for treatment, the patient has consented to  Procedure(s): TRANSURETHRAL RESECTION OF BLADDER TUMOR (TURBT) (N/A) as a surgical intervention .  The patient's history has been reviewed, patient examined, no change in status, stable for surgery.  I have reviewed the patient's chart and labs.  Questions were answered to the patient's satisfaction.     Carolan Clines I

## 2013-12-03 NOTE — Transfer of Care (Signed)
Immediate Anesthesia Transfer of Care Note  Patient: Jeffrey Horn  Procedure(s) Performed: Procedure(s): TRANSURETHRAL RESECTION OF BLADDER TUMOR (TURBT)AT BLADDER NECK 8CM, TRANSURETHRAL RESECTION OF PROSTATE (N/A)  Patient Location: PACU  Anesthesia Type:General  Level of Consciousness: awake, alert , oriented and patient cooperative  Airway & Oxygen Therapy: Patient Spontanous Breathing and Patient connected to face mask oxygen  Post-op Assessment: Report given to PACU RN, Post -op Vital signs reviewed and stable and Patient moving all extremities X 4  Post vital signs: stable  Complications: No apparent anesthesia complications

## 2013-12-03 NOTE — Op Note (Signed)
Pre-operative diagnosis :  Bladder neck bladder tumor (8 cm)   Postoperative diagnosis:  Same  Operation: Cystourethroscopy, transurethral resection of bladder neck bladder tumors (8 cm) with transurethral resection of prostate bladder neck and median lobe.  Surgeon:  Chauncey Cruel. Gaynelle Arabian, MD  First assistant:  None  Anesthesia:  General LMA  Preparation:  After appropriate preanesthesia, the patient was brought to the operating room, placed in the upper table in the dorsal supine position where general LMA anesthesia was introduced. The patient was then replaced in the dorsal lithotomy position with pubis was prepped with Betadine solution and draped in usual fashion. The arm band was double. The history was double  Review history:  1. Gross hematuria (599.71)  2. Bladder cancer (188.9)  3. Left nephrolithiasis (592.0)  3 bladder tumors identified with the flex scope within the L side of the bladder neck. When looking at the bladder neck, I can see the tim of one of the tumors on the L side of the bladder neck. I have explained this to the patient and shown him the tumors on the video screen. he will need TUR of the tumors and probably of the L side of the prostate also. I have also discussed this with his wife.    Statement of  Likelihood of Success: Excellent. TIME-OUT observed.:  Procedure:  Cystourethroscopy revealed normal-appearing external penile meatus. The urethra was normal without evidence of stricture disease. The prostate was trilobar with median lobe present. Just on the inside of the bladder neck, 8 cm of bladder cancer aggregate was identified. The trigone was identified, and the ureteral orifices were identified. I could not see urine from the orifices, and IV methylene blue was given. (5 cc). However, even at the end of procedure, I never saw blue contrast from the ureteral orifices. There was minimal trabeculation and no evidence of cellule formation. There is no diverticular  formation. There was no other evidence of bladder cancer, stone, or diverticular formation within the bladder.  Tumor resection was accomplished from the 7:00 to the 5:00 position, requiring both resection of the tumor, and of the bladder neck. The median lobe the prostate was resected as well. All tissue was submitted together to laboratory with identification on the pathology slip. This was accomplished as a second timeout.  All areas of resection were cauterized, and all tissue was submitted to laboratory. A 22 French hematuria catheter was placed with traction and continuous irrigation. The patient was awakened and taken to recovery room in good condition.

## 2013-12-04 DIAGNOSIS — N281 Cyst of kidney, acquired: Secondary | ICD-10-CM | POA: Diagnosis not present

## 2013-12-04 DIAGNOSIS — N401 Enlarged prostate with lower urinary tract symptoms: Secondary | ICD-10-CM | POA: Diagnosis not present

## 2013-12-04 DIAGNOSIS — N200 Calculus of kidney: Secondary | ICD-10-CM | POA: Diagnosis not present

## 2013-12-04 DIAGNOSIS — C679 Malignant neoplasm of bladder, unspecified: Secondary | ICD-10-CM | POA: Diagnosis not present

## 2013-12-04 DIAGNOSIS — R310 Gross hematuria: Secondary | ICD-10-CM | POA: Diagnosis not present

## 2013-12-04 DIAGNOSIS — R300 Dysuria: Secondary | ICD-10-CM | POA: Diagnosis not present

## 2013-12-04 MED ORDER — SODIUM CHLORIDE 0.9 % IJ SOLN
3.0000 mL | Freq: Two times a day (BID) | INTRAMUSCULAR | Status: DC
  Administered 2013-12-04: 3 mL via INTRAVENOUS

## 2013-12-04 MED ORDER — OXYCODONE-ACETAMINOPHEN 5-325 MG PO TABS
1.0000 | ORAL_TABLET | ORAL | Status: DC | PRN
Start: 2013-12-04 — End: 2013-12-05
  Administered 2013-12-04 – 2013-12-05 (×3): 1 via ORAL
  Filled 2013-12-04 (×3): qty 1

## 2013-12-04 NOTE — Progress Notes (Signed)
Urology Progress Note  1 Day Post-Op   s/p TURBT and TURP of bladder neck BT. Pt with minimal bleeding overnight.  Subjective:     No acute urologic events overnight. Ambulation:   positive Flatus:    positive Bowel movement  negative  Pain: some relief  Objective:  Blood pressure 90/61, pulse 68, temperature 97.9 F (36.6 C), temperature source Oral, resp. rate 18, height 5\' 11"  (1.803 m), weight 87.998 kg (194 lb), SpO2 91.00%.  Physical Exam:  General:  No acute distress, awake Resp: clear to auscultation bilaterally Genitourinary:  wnl Foley:yes. Some bleed    I/O last 3 completed shifts: In: 9226.3 [P.O.:120; I.V.:3906.3; Other:5200] Out: 9110 [Urine:9110]  No results found for this basename: HGB, WBC, PLT,  in the last 72 hours  No results found for this basename: NA, K, CL, CO2, BUN, CREATININE, CALCIUM, MAGNESIUM, GFRNONAA, GFRAA,  in the last 72 hours   No results found for this basename: PT, INR, APTT,  in the last 72 hours   No components found with this basename: ABG,   Assessment/Plan:  Catheter not removed. Continue any current medications. D/c planning for AM.  Will d/c [facility] today.

## 2013-12-04 NOTE — Care Management Note (Addendum)
    Page 1 of 1   12/05/2013     11:08:59 AM   CARE MANAGEMENT NOTE 12/05/2013  Patient:  Jeffrey Horn, Jeffrey Horn   Account Number:  [id number]  Date Initiated:  12/04/2013  Documentation initiated by:  Dessa Phi  Subjective/Objective Assessment:   68 Y/O M ADMITTED [facility],TUMOR.     Action/Plan:   FROM HOME.HAS PCP,PHARMACY.   Anticipated DC Date:  12/05/2013   Anticipated DC Plan:  [place]  CM consult      Choice offered to / List presented to:             Status of service:  Completed, signed off Medicare Important Message given?   (If response is "NO", the following Medicare IM given date fields will be blank) Date Medicare IM given:   Date Additional Medicare IM given:    Discharge Disposition:  HOME/SELF CARE  Per UR Regulation:  Reviewed for med. necessity/level of care/duration of stay  If discussed at [place] of Stay Meetings, dates discussed:    Comments:  12/04/13 Cordell Coke RN,BSN NCM [phone] S/P TUR OF BLADDER TUMOR.NO ANTICIPATED D/C NEEDS.

## 2013-12-04 NOTE — Progress Notes (Signed)
UR completed 

## 2013-12-05 ENCOUNTER — Encounter (HOSPITAL_COMMUNITY): Payer: Self-pay | Admitting: Urology

## 2013-12-05 DIAGNOSIS — R310 Gross hematuria: Secondary | ICD-10-CM | POA: Diagnosis not present

## 2013-12-05 DIAGNOSIS — R300 Dysuria: Secondary | ICD-10-CM | POA: Diagnosis not present

## 2013-12-05 DIAGNOSIS — C679 Malignant neoplasm of bladder, unspecified: Secondary | ICD-10-CM | POA: Diagnosis not present

## 2013-12-05 DIAGNOSIS — N401 Enlarged prostate with lower urinary tract symptoms: Secondary | ICD-10-CM | POA: Diagnosis not present

## 2013-12-05 DIAGNOSIS — N200 Calculus of kidney: Secondary | ICD-10-CM | POA: Diagnosis not present

## 2013-12-05 DIAGNOSIS — N281 Cyst of kidney, acquired: Secondary | ICD-10-CM | POA: Diagnosis not present

## 2013-12-05 MED ORDER — CIPROFLOXACIN HCL 500 MG PO TABS: 500.0000 mg | ORAL_TABLET | Freq: Two times a day (BID) | ORAL | Status: DC

## 2013-12-05 MED ORDER — URIBEL 118 MG PO CAPS: 1.0000 | ORAL_CAPSULE | Freq: Four times a day (QID) | ORAL | Status: DC | PRN

## 2013-12-05 MED ORDER — TRAMADOL-ACETAMINOPHEN 37.5-325 MG PO TABS: 1.0000 | ORAL_TABLET | Freq: Four times a day (QID) | ORAL | Status: DC | PRN

## 2013-12-05 NOTE — Discharge Summary (Signed)
  Physician Discharge Summary  Patient ID: Jeffrey Horn MRN: [id number] DOB/AGE: [birth year] 68 y.o.  Admit date: 12/03/2013 Discharge date: 12/05/2013  Admission Diagnoses: Left Bladder Mass, Bladder Tumors  Discharge Diagnoses:  Active Problems:   Bladder cancer   Discharged Condition: good  Hospital Course:   TURP/TURBT at bladder neck  Consults: none  Significant Diagnostic Studies: No results found.  Treatments: IV hydration  Discharge Exam: Blood pressure 111/68, pulse 87, temperature 99.5 F (37.5 C), temperature source Oral, resp. rate 16, height 5\' 11"  (1.803 m), weight 87.998 kg (194 lb), SpO2 91.00%. General appearance: alert and cooperative  Disposition: Pathology pending.   Discharge Orders   Future Orders Complete By Expires   Discharge patient  As directed    Discontinue IV  As directed        Medication List         ciprofloxacin 500 MG tablet  Commonly known as:  CIPRO  Take 1 tablet (500 mg total) by mouth 2 (two) times daily.     lovastatin 20 MG tablet  Commonly known as:  MEVACOR  Take 20 mg by mouth every evening.     multivitamin with minerals Tabs tablet  Take 1 tablet by mouth daily.     traMADol-acetaminophen 37.5-325 MG per tablet  Commonly known as:  ULTRACET  Take 1 tablet by mouth every 6 (six) hours as needed.     URIBEL 118 MG Caps  Take 1 capsule (118 mg total) by mouth 4 (four) times daily as needed.           Follow-up Information   Follow up with Ailene Rud, MD.   Specialty:  Urology   Contact information:   [facility]  [place] [address] [phone]       Follow up with Ailene Rud, MD.   Specialty:  Urology   Contact information:   [facility]  [place] [address] [phone]       Signed: Carolan Clines I 12/05/2013, 9:24 AM

## 2013-12-05 NOTE — Discharge Instructions (Signed)
Post transurethral resection of the prostate (TURP) instructions  Your recent prostate surgery requires very special post hospital care. Despite the fact that no skin incisions were used the area around the prostate incision is quite raw and is covered with a scab to promote healing and prevent bleeding. Certain cautions are needed to assure that the scab is not disturbed of the next 2-3 weeks while the healing proceeds.  Because the raw surface in your prostate and the irritating effects of urine you may expect frequency of urination and/or urgency (a stronger desire to urinate) and perhaps even getting up at night more often. This will usually resolve or improve slowly over the healing period. You may see some blood in your urine over the first 6 weeks. Do not be alarmed, even if the urine was clear for a while. Get off your feet and drink lots of fluids until clearing occurs. If you start to pass clots or don't improve call us.  Diet:  You may return to your normal diet immediately. Because of the raw surface of your bladder, alcohol, spicy foods, foods high in acid and drinks with caffeine may cause irritation or frequency and should be used in moderation. To keep your urine flowing freely and avoid constipation, drink plenty of fluids during the day (8-10 glasses). Tip: Avoid cranberry juice because it is very acidic.  Activity:  Your physical activity doesn't need to be restricted. However, if you are very active, you may see some blood in the urine. We suggest that you reduce your activity under the circumstances until the bleeding has stopped.  Bowels:  It is important to keep your bowels regular during the postoperative period. Straining with bowel movements can cause bleeding. A bowel movement every other day is reasonable. Use a mild laxative if needed, such as milk of magnesia 2-3 tablespoons, or 2 Dulcolax tablets. Call if you continue to have problems. If you had been taking narcotics  for pain, before, during or after your surgery, you may be constipated. Take a laxative if necessary.  Medication:  You should resume your pre-surgery medications unless told not to. In addition you may be given an antibiotic to prevent or treat infection. Antibiotics are not always necessary. All medication should be taken as prescribed until the bottles are finished unless you are having an unusual reaction to one of the drugs.     Problems you should report to [place]:  a. Fever greater than 101F. b. Heavy bleeding, or clots (see notes above about blood in urine). c. Inability to urinate. d. Drug reactions (hives, rash, nausea, vomiting, diarrhea). e. Severe burning or pain with urination that is not improving. Bladder Cancer Bladder cancer is an abnormal growth of tissue in your bladder. Your bladder is the balloon-like sac in your pelvis. It collects and stores urine that comes from the kidneys through the ureters. The bladder wall is made of layers. If cancer spreads into these layers and through the wall of the bladder, it becomes more difficult to treat.  There are four stages of bladder cancer:  Stage I. Cancer at this stage occurs in the bladder's inner lining but has not invaded the muscular bladder wall.  Stage II. At this stage, cancer has invaded the bladder wall but is still confined to the bladder.  Stage III. By this stage, the cancer cells have spread through the bladder wall to surrounding tissue. They may also have spread to the prostate in men or the uterus or vagina  in women.  Stage IV. By this stage, cancer cells may have spread to the lymph nodes and other organs, such as your lungs, bones, or liver. RISK FACTORS Although the cause of bladder cancer is not known, the following risk factors can increase your chances of getting bladder cancer:   Smoking.   Occupational exposures, such as rubber, leather, textile, dyes, chemicals, and paint.   Being white.    Age.   Being male.   Having chronic bladder inflammation.   Having a bladder cancer history.   Having a family history of bladder cancer (heredity).   Having had chemotherapy or radiation therapy to the pelvis.   Being exposed to arsenic.  SYMPTOMS   Blood in the urine.   Pain with urination.   Frequent bladder or urine infections.  Increase in urgency and frequency of urination. DIAGNOSIS  Your health care provider may suspect bladder cancer based on your description of urinary symptoms or based on the finding of blood or infection in the urine (especially if this has recurred several times). Other tests or procedures that may be performed include:   A narrow tube being inserted into your bladder through your urethra (cystoscopy) in order to view the lining of your bladder for tumors.   A biopsy to sample the tumor to see if cancer is present.  If cancer is present, it will then be staged to determine its severity and extent. It is important to know how deeply into the bladder wall the cancer has grown and whether the cancer has spread to any other parts of your body. Staging may require blood tests or special scans such as a CT scan, MRI, bone scan, or chest X-ray.  TREATMENT  Once your cancer has been diagnosed and staged, you should discuss a treatment plan with your health care provider. Based on the stage of the cancer, one treatment or a combination of treatments may be recommended. The most common forms of treatment are:   Surgery. Procedures that may be done include transurethral resection and cystectomy.  Radiation therapy. This is infrequently used to treat bladder cancer.   Chemotherapy. During this treatment, drugs are used to kill cancer cells.  Immunotherapy. This is usually administered directly into the bladder. HOME CARE INSTRUCTIONS  Only take over-the-counter or prescription medicines for pain, discomfort, or fever as directed by your  health care provider.   Maintain a healthy diet.   Consider joining a support group. This may help you learn to cope with the stress of having bladder cancer.   Seek advice to help you manage treatment side effects.   Keep all follow-up appointments as directed by your health care provider.   Inform your cancer specialist if you are admitted to the hospital.  [place] IF:  There is blood in your urine.  You have symptoms of a urinary tract infection. These include:  Tiredness.  Shakiness.  Weakness.  Muscle aches.  Abdominal pain.  Frequent and intense urge to urinate (in young women).  Burning feeling in the bladder or urethra during urination (in young women). SEEK IMMEDIATE MEDICAL CARE IF:  You are unable to urinate. Document Released: 10/07/2003 Document Revised: 06/06/2013 Document Reviewed: 03/27/2013 [facility] Patient Information 2014 [place].

## 2013-12-05 NOTE — Progress Notes (Signed)
Urology Progress Note  2 Days Post-Op  2 days post TURBT and TURP.Marland Kitchen Urine pink. No clots. Catheter out. + voided x 3.  Subjective:     No acute urologic events overnight. Ambulation:   positive Flatus:    positive Bowel movement  positive  Pain: some relief. C/o dysuria.   Objective:  Blood pressure 111/68, pulse 87, temperature 99.5 F (37.5 C), temperature source Oral, resp. rate 16, height 5\' 11"  (1.803 m), weight 87.998 kg (194 lb), SpO2 91.00%.  Physical Exam:  General:  No acute distress, awake Extremities: extremities normal, atraumatic, no cyanosis or edema Genitourinary:   Normal  Foley:  out    I/O last 3 completed shifts: In: 3762 [P.O.:1080; I.V.:1275; Other:3250] Out: 11000 [Urine:11000]  No results found for this basename: HGB, WBC, PLT,  in the last 72 hours  No results found for this basename: NA, K, CL, CO2, BUN, CREATININE, CALCIUM, MAGNESIUM, GFRNONAA, GFRAA,  in the last 72 hours   No results found for this basename: PT, INR, APTT,  in the last 72 hours   No components found with this basename: ABG,   Assessment/Plan:  Pt is to increase activities as tolerated.  RTC for follow-up Pathology pending.

## 2013-12-17 DIAGNOSIS — C679 Malignant neoplasm of bladder, unspecified: Secondary | ICD-10-CM | POA: Diagnosis not present

## 2013-12-17 DIAGNOSIS — N281 Cyst of kidney, acquired: Secondary | ICD-10-CM | POA: Diagnosis not present

## 2013-12-17 DIAGNOSIS — N200 Calculus of kidney: Secondary | ICD-10-CM | POA: Diagnosis not present

## 2014-01-16 DIAGNOSIS — C679 Malignant neoplasm of bladder, unspecified: Secondary | ICD-10-CM | POA: Diagnosis not present

## 2014-01-23 DIAGNOSIS — C679 Malignant neoplasm of bladder, unspecified: Secondary | ICD-10-CM | POA: Diagnosis not present

## 2014-01-23 DIAGNOSIS — N390 Urinary tract infection, site not specified: Secondary | ICD-10-CM | POA: Diagnosis not present

## 2014-01-30 DIAGNOSIS — C679 Malignant neoplasm of bladder, unspecified: Secondary | ICD-10-CM | POA: Diagnosis not present

## 2014-02-06 DIAGNOSIS — C679 Malignant neoplasm of bladder, unspecified: Secondary | ICD-10-CM | POA: Diagnosis not present

## 2014-02-06 DIAGNOSIS — R82998 Other abnormal findings in urine: Secondary | ICD-10-CM | POA: Diagnosis not present

## 2014-02-13 DIAGNOSIS — C679 Malignant neoplasm of bladder, unspecified: Secondary | ICD-10-CM | POA: Diagnosis not present

## 2014-02-20 DIAGNOSIS — C679 Malignant neoplasm of bladder, unspecified: Secondary | ICD-10-CM | POA: Diagnosis not present

## 2014-03-26 DIAGNOSIS — C679 Malignant neoplasm of bladder, unspecified: Secondary | ICD-10-CM | POA: Diagnosis not present

## 2014-03-27 DIAGNOSIS — R7989 Other specified abnormal findings of blood chemistry: Secondary | ICD-10-CM | POA: Diagnosis not present

## 2014-03-27 DIAGNOSIS — Z79899 Other long term (current) drug therapy: Secondary | ICD-10-CM | POA: Diagnosis not present

## 2014-03-27 DIAGNOSIS — E7800 Pure hypercholesterolemia, unspecified: Secondary | ICD-10-CM | POA: Diagnosis not present

## 2014-05-28 ENCOUNTER — Other Ambulatory Visit: Payer: Self-pay | Admitting: Dermatology

## 2014-05-28 DIAGNOSIS — L570 Actinic keratosis: Secondary | ICD-10-CM | POA: Diagnosis not present

## 2014-07-02 DIAGNOSIS — C675 Malignant neoplasm of bladder neck: Secondary | ICD-10-CM | POA: Diagnosis not present

## 2014-07-03 ENCOUNTER — Other Ambulatory Visit: Payer: Self-pay | Admitting: Urology

## 2014-07-03 DIAGNOSIS — Z23 Encounter for immunization: Secondary | ICD-10-CM | POA: Diagnosis not present

## 2014-07-04 ENCOUNTER — Encounter (HOSPITAL_BASED_OUTPATIENT_CLINIC_OR_DEPARTMENT_OTHER): Payer: Self-pay | Admitting: *Deleted

## 2014-07-05 ENCOUNTER — Encounter (HOSPITAL_BASED_OUTPATIENT_CLINIC_OR_DEPARTMENT_OTHER): Payer: Self-pay | Admitting: *Deleted

## 2014-07-05 NOTE — Progress Notes (Signed)
NPO AFTER MN.  ARRIVE AT 0845.  NEEDS HG.  

## 2014-07-05 NOTE — Progress Notes (Signed)
07/05/14 1145  OBSTRUCTIVE SLEEP APNEA  Have you ever been diagnosed with sleep apnea through a sleep study? No  Do you snore loudly (loud enough to be heard through closed doors)?  1  Do you often feel tired, fatigued, or sleepy during the daytime? 0  Has anyone observed you stop breathing during your sleep? 1  Do you have, or are you being treated for high blood pressure? 0  BMI more than 35 kg/m2? 0  Age over 69 years old? 1  Neck circumference greater than 40 cm/16 inches? 0  Gender: 1  Obstructive Sleep Apnea Score 4  Score 4 or greater  Results sent to PCP

## 2014-07-09 NOTE — Anesthesia Preprocedure Evaluation (Addendum)
Anesthesia Evaluation  Patient identified by MRN, date of birth, ID band Patient awake    Reviewed: Allergy & Precautions, H&P , NPO status , Patient's Chart, lab work & pertinent test results  History of Anesthesia Complications Negative for: history of anesthetic complications  Airway Mallampati: II TM Distance: >3 FB Neck ROM: Full    Dental no notable dental hx.    Pulmonary neg pulmonary ROS,  breath sounds clear to auscultation  Pulmonary exam normal       Cardiovascular Exercise Tolerance: Good Rhythm:Regular Rate:Normal     Neuro/Psych negative neurological ROS  negative psych ROS   GI/Hepatic negative GI ROS, Neg liver ROS,   Endo/Other  negative endocrine ROS  Renal/GU Renal disease  negative genitourinary   Musculoskeletal negative musculoskeletal ROS (+)   Abdominal   Peds negative pediatric ROS (+)  Hematology negative hematology ROS (+)   Anesthesia Other Findings   Reproductive/Obstetrics negative OB ROS                          Anesthesia Physical Anesthesia Plan  ASA: II  Anesthesia Plan: General   Post-op Pain Management:    Induction: Intravenous  Airway Management Planned: LMA  Additional Equipment:   Intra-op Plan:   Post-operative Plan: Extubation in OR  Informed Consent: I have reviewed the patients History and Physical, chart, labs and discussed the procedure including the risks, benefits and alternatives for the proposed anesthesia with the patient or authorized representative who has indicated his/her understanding and acceptance.   Dental advisory given  Plan Discussed with: CRNA  Anesthesia Plan Comments:         Anesthesia Quick Evaluation

## 2014-07-12 ENCOUNTER — Encounter (HOSPITAL_BASED_OUTPATIENT_CLINIC_OR_DEPARTMENT_OTHER)
Admission: RE | Disposition: A | Discharge status: Home / Self Care | Payer: Self-pay | Source: Ambulatory Visit | Attending: Urology

## 2014-07-12 ENCOUNTER — Ambulatory Visit (HOSPITAL_BASED_OUTPATIENT_CLINIC_OR_DEPARTMENT_OTHER): Payer: Medicare Other | Admitting: Anesthesiology

## 2014-07-12 ENCOUNTER — Encounter (HOSPITAL_BASED_OUTPATIENT_CLINIC_OR_DEPARTMENT_OTHER): Payer: Self-pay

## 2014-07-12 ENCOUNTER — Ambulatory Visit (HOSPITAL_BASED_OUTPATIENT_CLINIC_OR_DEPARTMENT_OTHER)
Admission: RE | Admit: 2014-07-12 | Discharge: 2014-07-12 | Disposition: A | Discharge status: Home / Self Care | Payer: Medicare Other | Source: Ambulatory Visit | Attending: Urology | Admitting: Urology

## 2014-07-12 ENCOUNTER — Encounter (HOSPITAL_BASED_OUTPATIENT_CLINIC_OR_DEPARTMENT_OTHER): Payer: Medicare Other | Admitting: Anesthesiology

## 2014-07-12 DIAGNOSIS — N200 Calculus of kidney: Secondary | ICD-10-CM | POA: Diagnosis not present

## 2014-07-12 DIAGNOSIS — N400 Enlarged prostate without lower urinary tract symptoms: Secondary | ICD-10-CM | POA: Diagnosis not present

## 2014-07-12 DIAGNOSIS — C679 Malignant neoplasm of bladder, unspecified: Secondary | ICD-10-CM | POA: Diagnosis not present

## 2014-07-12 DIAGNOSIS — D494 Neoplasm of unspecified behavior of bladder: Secondary | ICD-10-CM | POA: Diagnosis not present

## 2014-07-12 DIAGNOSIS — Z882 Allergy status to sulfonamides status: Secondary | ICD-10-CM | POA: Insufficient documentation

## 2014-07-12 DIAGNOSIS — C678 Malignant neoplasm of overlapping sites of bladder: Secondary | ICD-10-CM

## 2014-07-12 DIAGNOSIS — Z888 Allergy status to other drugs, medicaments and biological substances status: Secondary | ICD-10-CM | POA: Diagnosis not present

## 2014-07-12 DIAGNOSIS — Z85828 Personal history of other malignant neoplasm of skin: Secondary | ICD-10-CM | POA: Diagnosis not present

## 2014-07-12 DIAGNOSIS — IMO0001 Reserved for inherently not codable concepts without codable children: Secondary | ICD-10-CM | POA: Insufficient documentation

## 2014-07-12 DIAGNOSIS — N3289 Other specified disorders of bladder: Secondary | ICD-10-CM | POA: Diagnosis present

## 2014-07-12 DIAGNOSIS — E785 Hyperlipidemia, unspecified: Secondary | ICD-10-CM | POA: Diagnosis not present

## 2014-07-12 DIAGNOSIS — Z885 Allergy status to narcotic agent status: Secondary | ICD-10-CM | POA: Diagnosis not present

## 2014-07-12 HISTORY — DX: Cyst of kidney, acquired: N28.1

## 2014-07-12 HISTORY — DX: Nocturia: R35.1

## 2014-07-12 HISTORY — DX: Personal history of other malignant neoplasm of skin: Z85.828

## 2014-07-12 HISTORY — DX: Personal history of other malignant neoplasm of skin: Z98.890

## 2014-07-12 HISTORY — DX: Nodular prostate without lower urinary tract symptoms: N40.2

## 2014-07-12 HISTORY — DX: Calculus of kidney: N20.0

## 2014-07-12 HISTORY — PX: TRANSURETHRAL RESECTION OF BLADDER TUMOR WITH GYRUS (TURBT-GYRUS): SHX6458

## 2014-07-12 HISTORY — DX: Testicular hypofunction: E29.1

## 2014-07-12 LAB — POCT HEMOGLOBIN-HEMACUE: Hemoglobin: 14.4 g/dL (ref 13.0–17.0)

## 2014-07-12 SURGERY — TRANSURETHRAL RESECTION OF BLADDER TUMOR WITH GYRUS (TURBT-GYRUS)
Anesthesia: General | Site: Bladder

## 2014-07-12 MED ORDER — TRAMADOL HCL 50 MG PO TABS
50.0000 mg | ORAL_TABLET | Freq: Once | ORAL | Status: AC
  Administered 2014-07-12: 50 mg via ORAL
  Filled 2014-07-12: qty 1

## 2014-07-12 MED ORDER — PROPOFOL 10 MG/ML IV BOLUS
INTRAVENOUS | Status: DC | PRN
  Administered 2014-07-12: 200 mg via INTRAVENOUS

## 2014-07-12 MED ORDER — FENTANYL CITRATE 0.05 MG/ML IJ SOLN
25.0000 ug | INTRAMUSCULAR | Status: DC | PRN
  Filled 2014-07-12: qty 1

## 2014-07-12 MED ORDER — MIDAZOLAM HCL 5 MG/5ML IJ SOLN
INTRAMUSCULAR | Status: DC | PRN
  Administered 2014-07-12 (×2): 1 mg via INTRAVENOUS

## 2014-07-12 MED ORDER — CEFAZOLIN SODIUM-DEXTROSE 2-3 GM-% IV SOLR
2.0000 g | INTRAVENOUS | Status: AC
  Administered 2014-07-12: 2 g via INTRAVENOUS
  Filled 2014-07-12: qty 50

## 2014-07-12 MED ORDER — LACTATED RINGERS IV SOLN
INTRAVENOUS | Status: DC
  Administered 2014-07-12: 09:00:00 via INTRAVENOUS
  Filled 2014-07-12: qty 1000

## 2014-07-12 MED ORDER — DEXAMETHASONE SODIUM PHOSPHATE 4 MG/ML IJ SOLN
INTRAMUSCULAR | Status: DC | PRN
  Administered 2014-07-12: 10 mg via INTRAVENOUS

## 2014-07-12 MED ORDER — MEPERIDINE HCL 25 MG/ML IJ SOLN
6.2500 mg | INTRAMUSCULAR | Status: DC | PRN
  Filled 2014-07-12: qty 1

## 2014-07-12 MED ORDER — FENTANYL CITRATE 0.05 MG/ML IJ SOLN
INTRAMUSCULAR | Status: AC
  Filled 2014-07-12: qty 6

## 2014-07-12 MED ORDER — KETOROLAC TROMETHAMINE 30 MG/ML IJ SOLN
INTRAMUSCULAR | Status: DC | PRN
  Administered 2014-07-12: 30 mg via INTRAVENOUS

## 2014-07-12 MED ORDER — URIBEL 118 MG PO CAPS: 1.0000 | ORAL_CAPSULE | Freq: Two times a day (BID) | ORAL | Status: DC | PRN

## 2014-07-12 MED ORDER — FENTANYL CITRATE 0.05 MG/ML IJ SOLN
INTRAMUSCULAR | Status: DC | PRN
  Administered 2014-07-12 (×8): 25 ug via INTRAVENOUS

## 2014-07-12 MED ORDER — SODIUM CHLORIDE 0.9 % IR SOLN
Status: DC | PRN
  Administered 2014-07-12 (×2): 6000 mL via INTRAVESICAL
  Administered 2014-07-12: 500 mL via INTRAVESICAL

## 2014-07-12 MED ORDER — FINASTERIDE 5 MG PO TABS: 5.0000 mg | ORAL_TABLET | Freq: Every day | ORAL | Status: DC

## 2014-07-12 MED ORDER — ONDANSETRON HCL 4 MG/2ML IJ SOLN
INTRAMUSCULAR | Status: DC | PRN
  Administered 2014-07-12: 4 mg via INTRAVENOUS

## 2014-07-12 MED ORDER — LIDOCAINE HCL (CARDIAC) 20 MG/ML IV SOLN
INTRAVENOUS | Status: DC | PRN
  Administered 2014-07-12: 100 mg via INTRAVENOUS

## 2014-07-12 MED ORDER — TRIMETHOPRIM 100 MG PO TABS: 100.0000 mg | ORAL_TABLET | ORAL | Status: DC

## 2014-07-12 MED ORDER — TRAMADOL HCL 50 MG PO TABS
ORAL_TABLET | ORAL | Status: AC
  Filled 2014-07-12: qty 1

## 2014-07-12 MED ORDER — TRAMADOL-ACETAMINOPHEN 37.5-325 MG PO TABS: 1.0000 | ORAL_TABLET | Freq: Four times a day (QID) | ORAL | Status: DC | PRN

## 2014-07-12 MED ORDER — CEFAZOLIN SODIUM-DEXTROSE 2-3 GM-% IV SOLR
INTRAVENOUS | Status: AC
  Filled 2014-07-12: qty 50

## 2014-07-12 MED ORDER — BELLADONNA ALKALOIDS-OPIUM 16.2-60 MG RE SUPP
RECTAL | Status: AC
  Filled 2014-07-12: qty 1

## 2014-07-12 MED ORDER — LACTATED RINGERS IV SOLN
INTRAVENOUS | Status: DC | PRN
  Administered 2014-07-12 (×2): via INTRAVENOUS

## 2014-07-12 MED ORDER — MIDAZOLAM HCL 2 MG/2ML IJ SOLN
INTRAMUSCULAR | Status: AC
  Filled 2014-07-12: qty 2

## 2014-07-12 MED ORDER — STERILE WATER FOR IRRIGATION IR SOLN
Status: DC | PRN
  Administered 2014-07-12: 10 mL

## 2014-07-12 MED ORDER — PROMETHAZINE HCL 25 MG/ML IJ SOLN
6.2500 mg | INTRAMUSCULAR | Status: DC | PRN
  Filled 2014-07-12: qty 1

## 2014-07-12 MED ORDER — ACETAMINOPHEN 10 MG/ML IV SOLN
INTRAVENOUS | Status: DC | PRN
  Administered 2014-07-12: 1000 mg via INTRAVENOUS

## 2014-07-12 SURGICAL SUPPLY — 25 items
BAG DRAIN URO-CYSTO SKYTR STRL (DRAIN) ×2 IMPLANT
BAG DRN ANRFLXCHMBR STRAP LEK (BAG) ×1
BAG DRN UROCATH (DRAIN) ×1
BAG URINE DRAINAGE (UROLOGICAL SUPPLIES) ×1 IMPLANT
BAG URINE LEG 19OZ MD ST LTX (BAG) ×1 IMPLANT
BOOTIES KNEE HIGH SLOAN (MISCELLANEOUS) ×2 IMPLANT
CANISTER SUCT LVC 12 LTR MEDI- (MISCELLANEOUS) ×6 IMPLANT
CATH FOLEY 2WAY SLVR  5CC 18FR (CATHETERS) ×1
CATH FOLEY 2WAY SLVR 5CC 18FR (CATHETERS) IMPLANT
CLOTH BEACON ORANGE TIMEOUT ST (SAFETY) ×2 IMPLANT
DRAPE CAMERA CLOSED 9X96 (DRAPES) ×2 IMPLANT
ELECT LOOP MED HF 24F 12D CBL (CLIP) ×1 IMPLANT
GLOVE BIO SURGEON STRL SZ7.5 (GLOVE) ×2 IMPLANT
GLOVE BIOGEL PI IND STRL 7.5 (GLOVE) IMPLANT
GLOVE BIOGEL PI INDICATOR 7.5 (GLOVE) ×1
GOWN STRL REIN XL XLG (GOWN DISPOSABLE) ×2 IMPLANT
GOWN STRL REUS W/TWL XL LVL3 (GOWN DISPOSABLE) ×1 IMPLANT
HOLDER FOLEY CATH W/STRAP (MISCELLANEOUS) ×1 IMPLANT
IV NS IRRIG 3000ML ARTHROMATIC (IV SOLUTION) ×4 IMPLANT
NS IRRIG 500ML POUR BTL (IV SOLUTION) ×1 IMPLANT
PACK CYSTO (CUSTOM PROCEDURE TRAY) ×2 IMPLANT
PLUG CATH AND CAP STER (CATHETERS) IMPLANT
SET ASPIRATION TUBING (TUBING) ×1 IMPLANT
SYRINGE IRR TOOMEY STRL 70CC (SYRINGE) ×2 IMPLANT
WATER STERILE IRR 500ML POUR (IV SOLUTION) ×1 IMPLANT

## 2014-07-12 NOTE — H&P (Signed)
Reason For Visit 3 mo f/u & cystoscopy   Active Problems Problems  1. Bilateral kidney stones (592.0)   Assessed By: Carolan Clines (Urology); Last Assessed: 06 Nov 2013 2. Stage I papillary adenocarcinoma of bladder (188.9)   Assessed By: Carolan Clines (Urology); Last Assessed: 02 Jul 2014  History of Present Illness    68 yo married Horn returns today for a 3 mo f/u & cystoscopy. He is s/p 6 treatments of BCG instillations (last one was 02/20/14). He is s/p TURBT & TURP on 12/03/13. He has current IPSS=14.     Hx of gross hematuria. Originally referred by Dr. Rex Kras for further evaluation of asymptomatic gross hematuria & passing blood clots. He states that this happened on 10/05/13 & lasted x 5 days. His urine has since been clear. IPSS= 1 only.       He had a CT on 10/05/13 that showed 6.7cm low density lesion on the Rt renal lower pole, that mostly likely is a renal cyst. It also showed that he had bilateral kidney stones. Stones in the Lt kidney are larger than the Rt.      Low dose ASA, but off since Dec 17th. No tobacco (neg history). Employed as Games developer.   Past Medical History Problems  1. History of BPH (benign prostatic hypertrophy) (600.00) 2. History of fibromyalgia (V13.59) 3. History of hyperlipidemia (V12.29) 4. History of malignant neoplasm of skin (V10.83) 5. History of renal calculi (V13.01) 6. History of Low testosterone (257.2)  Surgical History Problems  1. History of Cholecystectomy 2. History of Cystoscopy With Fulguration Large Lesion (Over 5cm) 3. History of Eye Surgery 4. History of Hemorrhoidectomy 5. History of Transurethral Resection Of Prostate (TURP)  Current Meds 1. AndroGel 50 MG/5GM (1%) Transdermal Gel;  Therapy: [id number] to Recorded 2. Diazepam 10 MG Oral Tablet; Take tablet 1 hour prior to procedure;  Therapy: [id number] to (Last Rx:01Apr2015) Ordered 3. Finasteride 5 MG Oral Tablet; TAKE 1 TABLET  DAILY AS DIRECTED;  Therapy: [id number] to (Evaluate:03Jun2016)  Requested for: [id number];  Last Rx:09Jun2015 Ordered 4. Lovastatin 10 MG Oral Tablet;  Therapy: [id number] to Recorded 5. Oxycodone-Acetaminophen 5-325 MG Oral Tablet; TAKE 1 TABLET 3  times daily for kidney stone pain;  Therapy: [id number] to (Last Rx:02Mar2015) Ordered  Allergies Medication  1. Hydrocodone-Acetaminophen CAPS 2. Sulfa Drugs 3. Lovastatin TABS 4. Pravastatin Sodium TABS  Family History Problems  1. Family history of lung cancer (V16.1) : Father  Social History Problems  1. Alcohol use   2 per day 2. Caffeine use (V49.89)   6-8 per day 3. Father deceased 50. Married 5. Mother deceased 22. Never a smoker 7. Occupation   Games developer 8. Three children   daughters  Review of Systems Genitourinary, constitutional, skin, eye, otolaryngeal, hematologic/lymphatic, cardiovascular, pulmonary, endocrine, musculoskeletal, gastrointestinal, neurological and psychiatric system(s) were reviewed and pertinent findings if present are noted.  Genitourinary: nocturia, hematuria and erectile dysfunction, but no urinary frequency, no feelings of urinary urgency, no difficulty starting the urinary stream, urine stream is not weak, urinary stream does not start and stop, no incomplete emptying of bladder and initiating urination does not require straining.  Gastrointestinal: no nausea, no vomiting, no heartburn, no diarrhea and no constipation.  Constitutional: no fever, no night sweats, not feeling tired (fatigue) and no recent weight loss.  Integumentary: no new skin rashes or lesions and no pruritus.  Eyes: no blurred vision and no diplopia.  ENT: no sore  throat and no sinus problems.  Hematologic/Lymphatic: no tendency to easily bruise and no swollen glands.  Cardiovascular: no chest pain and no leg swelling.  Respiratory: no shortness of breath and no cough.  Endocrine: no polydipsia.   Musculoskeletal: joint pain, but no back pain.  Neurological: no headache and no dizziness.  Psychiatric: no anxiety and no depression.    Vitals Vital Signs [Data Includes: Last 1 Day]  Recorded: 15Sep2015 02:41PM  Blood Pressure: 105 / 72 Temperature: 97.5 F Heart Rate: 80  Physical Exam Constitutional: Well nourished and well developed . No acute distress.  ENT:. The ears and nose are normal in appearance.  Neck: The appearance of the neck is normal and no neck mass is present.  Cardiovascular: Heart rate and rhythm are normal . No peripheral edema.  Abdomen: The abdomen is soft and nontender. No masses are palpated. No CVA tenderness. No hernias are palpable. No hepatosplenomegaly noted.  Genitourinary: Examination of the penis demonstrates no discharge, no masses, no lesions and a normal meatus. The scrotum is without lesions. The right epididymis is palpably normal and non-tender. The left epididymis is palpably normal and non-tender. The right testis is non-tender and without masses. The left testis is non-tender and without masses.  Lymphatics: The femoral and inguinal nodes are not enlarged or tender.  Skin: Normal skin turgor, no visible rash and no visible skin lesions.  Neuro/Psych:. Mood and affect are appropriate.    Results/Data  02 Jul 2014 2:01 PM  UA With REFLEX    COLOR YELLOW     APPEARANCE CLEAR     SPECIFIC GRAVITY <1.005     pH 5.0     GLUCOSE NEG     BILIRUBIN NEG     KETONE NEG     BLOOD NEG     PROTEIN NEG     UROBILINOGEN 0.2     NITRITE NEG     LEUKOCYTE ESTERASE NEG  Procedure  Procedure: Cystoscopy  Chaperone Present: kim lewis.  Indication: History of Urothelial Carcinoma.  Informed Consent: Risks, benefits, and potential adverse events were discussed and informed consent was obtained from the patient.  Prep: The patient was prepped with betadine.  Anesthesia:. Local anesthesia was administered intraurethrally with 2% lidocaine jelly.   Antibiotic prophylaxis: Ciprofloxacin.  Procedure Note:  Anterior urethra: No abnormalities.  Prostatic urethra: No abnormalities . Was identified. Right prostatic lobe lesion with calcification.  Bladder: Visulization was clear. The ureteral orifices were in the normal anatomic position bilaterally. A systematic survey of the bladder demonstrated no bladder tumors or stones. Examination of the bladder demonstrated no clot within the bladder, no trabeculation and no diverticulum no erythematous mucosa, no edema and no cellules. The patient tolerated the procedure well.  Complications: None.    Assessment Assessed  1. Bladder mass (596.89) 2. Stage I papillary adenocarcinoma of bladder (188.9)  Mass in the R bladder neck, possibly representing a tumor with calcification . Shown to pt. He will need TUR-BT. Will try to not leave foley cath.   Plan Stage I papillary adenocarcinoma of bladder  1. Follow-up Schedule Surgery Office  Follow-up  Status: Complete  Done:  [id number]  Schedule surgery.   Discussion/Summary cc: Hulan Fess, MD     Signatures Electronically signed by : Carolan Clines, M.D.; Jul 02 2014  5:18PM EST

## 2014-07-12 NOTE — Transfer of Care (Signed)
Immediate Anesthesia Transfer of Care Note  Patient: Jeffrey Horn  Procedure(s) Performed: Procedure(s) (LRB): TRANSURETHRAL RESECTION OF BLADDER TUMOR WITH GYRUS (TURBT-GYRUS) (N/A)  Patient Location: PACU  Anesthesia Type: General  Level of Consciousness: awake, sedated, patient cooperative and responds to stimulation  Airway & Oxygen Therapy: Patient Spontanous Breathing and Patient connected to face mask oxygen  Post-op Assessment: Report given to PACU RN, Post -op Vital signs reviewed and stable and Patient moving all extremities  Post vital signs: Reviewed and stable  Complications: No apparent anesthesia complications

## 2014-07-12 NOTE — Anesthesia Procedure Notes (Signed)
Procedure Name: LMA Insertion Date/Time: 07/12/2014 11:19 AM Performed by: Justice Rocher Pre-anesthesia Checklist: Patient identified, Emergency Drugs available, Suction available and Patient being monitored Patient Re-evaluated:Patient Re-evaluated prior to inductionOxygen Delivery Method: Circle System Utilized Preoxygenation: Pre-oxygenation with 100% oxygen Intubation Type: IV induction Ventilation: Mask ventilation without difficulty LMA: LMA inserted LMA Size: 4.0 Number of attempts: 1 Airway Equipment and Method: bite block Placement Confirmation: positive ETCO2 Tube secured with: Tape Dental Injury: Teeth and Oropharynx as per pre-operative assessment

## 2014-07-12 NOTE — Anesthesia Postprocedure Evaluation (Signed)
  Anesthesia Post-op Note  Patient: Jeffrey Horn  Procedure(s) Performed: Procedure(s) (LRB): TRANSURETHRAL RESECTION OF BLADDER TUMOR WITH GYRUS (TURBT-GYRUS) (N/A)  Patient Location: PACU  Anesthesia Type: General  Level of Consciousness: awake and alert   Airway and Oxygen Therapy: Patient Spontanous Breathing  Post-op Pain: mild  Post-op Assessment: Post-op Vital signs reviewed, Patient's Cardiovascular Status Stable, Respiratory Function Stable, Patent Airway and No signs of Nausea or vomiting  Last Vitals:  Filed Vitals:   07/12/14 1245  BP: 108/70  Pulse: 81  Temp:   Resp: 15    Post-op Vital Signs: stable   Complications: No apparent anesthesia complications

## 2014-07-12 NOTE — Op Note (Signed)
Pre-operative diagnosis :  recurrnt bladder tumors  Postoperative diagnosis:  same  Operation:  TUR and cold cup excisional biopsy, recurrent bladder tumors, (1) Right base  ( 1cm); (2,3) Right Lateral wall (1cm), (4/5)bladder dome ( 0.5cm x 2); (6) L bladder base ( 3cm).    Surgeon:  Chauncey Cruel. Gaynelle Arabian, MD  First assistant:  None  Anesthesia:  General LMA  Preparation: After appropriate preanesthesia, the patient was brought into the OR and placed on the OR table in the dorsal supine position, where general LMA anesthesia was administered. He was then replaced in the dorsal lithotomy posion, and the penis and pubis were prepped with betadine and draped in the usual fashion. The armband was double-checked and the history was double-checked.   Review history:  Problems  1. Bilateral kidney stones (592.0)   Assessed By: Carolan Clines (Urology); Last Assessed: 06 Nov 2013  2. Stage I papillary adenocarcinoma of bladder (188.9)   Assessed By: Carolan Clines (Urology); Last Assessed: 02 Jul 2014  History of Present Illness  68 yo married male returns today for a 3 mo f/u & cystoscopy. He is s/p 6 treatments of BCG instillations (last one was 02/20/14). He is s/p TURBT & TURP on 12/03/13. He has current IPSS=14.  Hx of gross hematuria. Originally referred by Dr. Rex Kras for further evaluation of asymptomatic gross hematuria & passing blood clots. He states that this happened on 10/05/13 & lasted x 5 days. His urine has since been clear. IPSS= 1 only.      Statement of  Likelihood of Success: Excellent. TIME-OUT observed.:  Procedure:  Continuous flow cystoscopy was accomplished with both 12 degree and 30 degree lenses. The right side of the prostate was well-resected, and and old blood clot was identified on it, but no tumor was identified. No bladder neck contracture was seen. Cystoscopy revealed multiple low grade tumor recurrences, on the Right bladder base, the right lateral wall,  the bladder dome, and a 3cm tumor on the Left bladder base. these were all resected or excised with the cold-cup biopsy forceps.  The biopsies were deep, and it was felt that the patient should not have Mitomycin -C today. However, because of the multiple tumors resected, it was felt that he should have a foley catheter over the weekend, and therefore an 40 F foley catheter was inserted, with 10cc sterile water in the balloon.    He was not given a B/O suppository because of his hx of narcotic allergy, but will be given tramadol for home use because he has taken that before successfully.    He was awakened and taken to the PAR. Urine was clear.

## 2014-07-12 NOTE — Interval H&P Note (Signed)
History and Physical Interval Note:  07/12/2014 10:53 AM  Jeffrey Horn  has presented today for surgery, with the diagnosis of bladder neck tumor  The various methods of treatment have been discussed with the patient and family. After consideration of risks, benefits and other options for treatment, the patient has consented to  Procedure(s): TRANSURETHRAL RESECTION OF BLADDER TUMOR WITH GYRUS (TURBT-GYRUS) (N/A) as a surgical intervention .  The patient's history has been reviewed, patient examined, no change in status, stable for surgery.  I have reviewed the patient's chart and labs.  Questions were answered to the patient's satisfaction.     Carolan Clines I

## 2014-07-12 NOTE — Discharge Instructions (Addendum)
Bladder Cancer Bladder cancer is an abnormal growth of tissue in your bladder. Your bladder is the balloon-like sac in your pelvis. It collects and stores urine that comes from the kidneys through the ureters. The bladder wall is made of layers. If cancer spreads into these layers and through the wall of the bladder, it becomes more difficult to treat.  There are four stages of bladder cancer:  Stage I. Cancer at this stage occurs in the bladder's inner lining but has not invaded the muscular bladder wall.  Stage II. At this stage, cancer has invaded the bladder wall but is still confined to the bladder.  Stage III. By this stage, the cancer cells have spread through the bladder wall to surrounding tissue. They may also have spread to the prostate in men or the uterus or vagina in women.  Stage IV. By this stage, cancer cells may have spread to the lymph nodes and other organs, such as your lungs, bones, or liver. RISK FACTORS Although the cause of bladder cancer is not known, the following risk factors can increase your chances of getting bladder cancer:   Smoking.   Occupational exposures, such as rubber, leather, textile, dyes, chemicals, and paint.  Being white.  Age.   Being male.   Having chronic bladder inflammation.   Having a bladder cancer history.   Having a family history of bladder cancer (heredity).   Having had chemotherapy or radiation therapy to the pelvis.   Being exposed to arsenic.  SYMPTOMS   Blood in the urine.   Pain with urination.   Frequent bladder or urine infections.  Increase in urgency and frequency of urination. DIAGNOSIS  Your health care provider may suspect bladder cancer based on your description of urinary symptoms or based on the finding of blood or infection in the urine (especially if this has recurred several times). Other tests or procedures that may be performed include:   A narrow tube being inserted into your bladder  through your urethra (cystoscopy) in order to view the lining of your bladder for tumors.   A biopsy to sample the tumor to see if cancer is present.  If cancer is present, it will then be staged to determine its severity and extent. It is important to know how deeply into the bladder wall the cancer has grown and whether the cancer has spread to any other parts of your body. Staging may require blood tests or special scans such as a CT scan, MRI, bone scan, or chest X-ray.  TREATMENT  Once your cancer has been diagnosed and staged, you should discuss a treatment plan with your health care provider. Based on the stage of the cancer, one treatment or a combination of treatments may be recommended. The most common forms of treatment are:   Surgery. Procedures that may be done include transurethral resection and cystectomy.  Radiation therapy. This is infrequently used to treat bladder cancer.   Chemotherapy. During this treatment, drugs are used to kill cancer cells.  Immunotherapy. This is usually administered directly into the bladder. HOME CARE INSTRUCTIONS  Take medicines only as directed by your health care provider.   Maintain a healthy diet.   Consider joining a support group. This may help you learn to cope with the stress of having bladder cancer.   Seek advice to help you manage treatment side effects.   Keep all follow-up visits as directed by your health care provider.   Inform your cancer specialist if you are  admitted to the hospital.  Iva IF:  There is blood in your urine.  You have symptoms of a urinary tract infection. These include:  Tiredness.  Shakiness.  Weakness.  Muscle aches.  Abdominal pain.  Frequent and intense urge to urinate (in young women).  Burning feeling in the bladder or urethra during urination (in young women). SEEK IMMEDIATE MEDICAL CARE IF:  You are unable to urinate. Document Released: 10/07/2003 Document  Revised: 02/18/2014 Document Reviewed: 03/27/2013 [facility] Patient Information 2015 [place], [place]. This information is not intended to replace advice given to you by your health care provider. Make sure you discuss any questions you have with your health care provider.  Post Anesthesia Home Care Instructions  Activity: Get plenty of rest for the remainder of the day. A responsible adult should stay with you for 24 hours following the procedure.  For the next 24 hours, DO NOT: -Drive a car -Paediatric nurse -Drink alcoholic beverages -Take any medication unless instructed by your physician -Make any legal decisions or sign important papers.  Meals: Start with liquid foods such as gelatin or soup. Progress to regular foods as tolerated. Avoid greasy, spicy, heavy foods. If nausea and/or vomiting occur, drink only clear liquids until the nausea and/or vomiting subsides. Call your physician if vomiting continues.  Special Instructions/Symptoms: Your throat may feel dry or sore from the anesthesia or the breathing tube placed in your throat during surgery. If this causes discomfort, gargle with warm salt water. The discomfort should disappear within 24 hours.   Foley Catheter Care A Foley catheter is a soft, flexible tube. This tube is placed into your bladder to drain pee (urine). If you go home with this catheter in place, follow the instructions below. TAKING CARE OF THE CATHETER 1. Wash your hands with soap and water. 2. Put soap and water on a clean washcloth.  Clean the skin where the tube goes into your body.  Clean away from the tube site.  Never wipe toward the tube.  Clean the area using a circular motion.  Remove all the soap. Pat the area dry with a clean towel. For males, reposition the skin that covers the end of the penis (foreskin). 3. Attach the tube to your leg with tape or a leg strap. Do not stretch the tube tight. If you are using tape, remove any stickiness  left behind by past tape you used. 4. Keep the drainage bag below your hips. Keep it off the floor. 5. Check your tube during the day. Make sure it is working and draining. Make sure the tube does not curl, twist, or bend. 6. Do not pull on the tube or try to take it out. TAKING CARE OF THE DRAINAGE BAGS You will have a large overnight drainage bag and a small leg bag. You may wear the overnight bag any time. Never wear the small bag at night. Follow the directions below. Emptying the Drainage Bag Empty your drainage bag when it is  - full or at least 2-3 times a day. 1. Wash your hands with soap and water. 2. Keep the drainage bag below your hips. 3. Hold the dirty bag over the toilet or clean container. 4. Open the pour spout at the bottom of the bag. Empty the pee into the toilet or container. Do not let the pour spout touch anything. 5. Clean the pour spout with a gauze pad or cotton ball that has rubbing alcohol on it. 6. Close the pour  spout. 7. Attach the bag to your leg with tape or a leg strap. 8. Wash your hands well. Changing the Drainage Bag Change your bag once a month or sooner if it starts to smell or look dirty.  1. Wash your hands with soap and water. 2. Pinch the rubber tube so that pee does not spill out. 3. Disconnect the catheter tube from the drainage tube at the connection valve. Do not let the tubes touch anything. 4. Clean the end of the catheter tube with an alcohol wipe. Clean the end of a the drainage tube with a different alcohol wipe. 5. Connect the catheter tube to the drainage tube of the clean drainage bag. 6. Attach the new bag to the leg with tape or a leg strap. Avoid attaching the new bag too tightly. 7. Wash your hands well. Cleaning the Drainage Bag 1. Wash your hands with soap and water. 2. Wash the bag in warm, soapy water. 3. Rinse the bag with warm water. 4. Fill the bag with a mixture of white vinegar and water (1 cup vinegar to 1 quart warm  water [.2 liter vinegar to 1 liter warm water]). Close the bag and soak it for 30 minutes in the solution. 5. Rinse the bag with warm water. 6. Hang the bag to dry with the pour spout open and hanging downward. 7. Store the clean bag (once it is dry) in a clean plastic bag. 8. Wash your hands well. PREVENT INFECTION  Wash your hands before and after touching your tube.  Take showers every day. Wash the skin where the tube enters your body. Do not take baths. Replace wet leg straps with dry ones, if this applies.  Do not use powders, sprays, or lotions on the genital area. Only use creams, lotions, or ointments as told by your doctor.  For females, wipe from front to back after going to the bathroom.  Drink enough fluids to keep your pee clear or pale yellow unless you are told not to have too much fluid (fluid restriction).  Do not let the drainage bag or tubing touch or lie on the floor.  Wear cotton underwear to keep the area dry. GET HELP IF:  Your pee is cloudy or smells unusually bad.  Your tube becomes clogged.  You are not draining pee into the bag or your bladder feels full.  Your tube starts to leak. GET HELP RIGHT AWAY IF:  You have pain, puffiness (swelling), redness, or yellowish-white fluid (pus) where the tube enters the body.  You have pain in the belly (abdomen), legs, lower back, or bladder.  You have a fever.  You see blood fill the tube, or your pee is pink or red.  You feel sick to your stomach (nauseous), throw up (vomit), or have chills.  Your tube gets pulled out. MAKE SURE YOU:   Understand these instructions.  Will watch your condition.  Will get help right away if you are not doing well or get worse. Document Released: 01/29/2013 Document Revised: 02/18/2014 Document Reviewed: 01/29/2013 [facility] Patient Information 2015 [place], [place]. This information is not intended to replace advice given to you by your health care provider. Make sure  you discuss any questions you have with your health care provider.

## 2014-07-15 ENCOUNTER — Encounter (HOSPITAL_BASED_OUTPATIENT_CLINIC_OR_DEPARTMENT_OTHER): Payer: Self-pay | Admitting: Urology

## 2014-07-15 DIAGNOSIS — R339 Retention of urine, unspecified: Secondary | ICD-10-CM | POA: Diagnosis not present

## 2014-07-19 DIAGNOSIS — C672 Malignant neoplasm of lateral wall of bladder: Secondary | ICD-10-CM | POA: Diagnosis not present

## 2014-07-19 DIAGNOSIS — C670 Malignant neoplasm of trigone of bladder: Secondary | ICD-10-CM | POA: Diagnosis not present

## 2014-07-19 DIAGNOSIS — C673 Malignant neoplasm of anterior wall of bladder: Secondary | ICD-10-CM | POA: Diagnosis not present

## 2014-07-19 DIAGNOSIS — C671 Malignant neoplasm of dome of bladder: Secondary | ICD-10-CM | POA: Diagnosis not present

## 2014-07-29 DIAGNOSIS — C670 Malignant neoplasm of trigone of bladder: Secondary | ICD-10-CM | POA: Diagnosis not present

## 2014-08-02 ENCOUNTER — Other Ambulatory Visit: Payer: Self-pay

## 2014-08-05 ENCOUNTER — Telehealth: Payer: Self-pay | Admitting: Oncology

## 2014-08-05 NOTE — Telephone Encounter (Signed)
S/W PATIENT AND GAVE NP APPT FOR 10/23 @ 11 W/DR. SHADAD.  REFERRING DR. TANNENBAUM DX- MALIGNANT NEOPLASM OF ANTERIOR WALL OR URINARY BLADDER

## 2014-08-07 ENCOUNTER — Encounter: Payer: Self-pay | Admitting: Oncology

## 2014-08-07 NOTE — Telephone Encounter (Signed)
This gentleman will be a new patient.  New patient visit is scheduled for Friday.  Message routed to provider for review.

## 2014-08-09 ENCOUNTER — Ambulatory Visit: Payer: Medicare Other

## 2014-08-09 ENCOUNTER — Ambulatory Visit (HOSPITAL_BASED_OUTPATIENT_CLINIC_OR_DEPARTMENT_OTHER): Payer: Medicare Other | Admitting: Oncology

## 2014-08-09 ENCOUNTER — Encounter: Payer: Self-pay | Admitting: Oncology

## 2014-08-09 ENCOUNTER — Telehealth: Payer: Self-pay | Admitting: Oncology

## 2014-08-09 ENCOUNTER — Other Ambulatory Visit: Payer: Medicare Other

## 2014-08-09 VITALS — BP 130/81 | HR 81 | Temp 97.8°F | Resp 18 | Ht 71.0 in | Wt 193.8 lb

## 2014-08-09 DIAGNOSIS — C675 Malignant neoplasm of bladder neck: Secondary | ICD-10-CM

## 2014-08-09 DIAGNOSIS — C7911 Secondary malignant neoplasm of bladder: Secondary | ICD-10-CM | POA: Diagnosis not present

## 2014-08-09 DIAGNOSIS — C679 Malignant neoplasm of bladder, unspecified: Secondary | ICD-10-CM

## 2014-08-09 NOTE — Progress Notes (Signed)
Checked in new pt with no financial concerns at this time.  Pt has 2 insurances so financial assistance may not be required but he has my card for any questions or concerns.

## 2014-08-09 NOTE — Consult Note (Signed)
Reason for Referral: Bladder cancer.   HPI: 68 year old gentleman currently of [place] where he lived the 76 of his adult life. He is a healthy gentleman with history of nephrolithiasis and hyperlipidemia. He was in his usual state of health when he presented with hematuria in September of 2014. He was evaluated at that time with a CT scan in December of 2014 which showed a 6.7 cm renal cyst but no other abnormalities. He subsequently underwent a cystourethroscopy and transurethral resection of a bladder tumor at the neck of the bladder was measuring 8 cm. The pathology showed high-grade papillary U. history of carcinoma without any muscle invasion. The muscularis propria was present but not involved. He subsequently did well and had a repeat cystoscopy in September of 2015 which showed 5 separate tumors including the right base, right bladder wall, no mother bladder and bladder base. He was offered BCG with interferon by Dr. Era Bumpers as a potential treatment. He was also was evaluated by Dr. Rosana Hoes who recommended a different treatment than BCG. He was referred to me for evaluation for possible treatment with mitomycin-C.  Clinically, he is asymptomatic at this point. He does report some occasional flank pain but no hematuria. He does not report any headaches or blurry vision or double vision or syncope. He did not report any chest pain or palpitation. He does not report any orthopnea, PND or leg edema. He does not report any cough, shortness of breath or hemoptysis. He does not report any nausea or vomiting. He does not report any change in his bowel habits. He does not report any frequency urgency or hesitancy. Has not reported any skeletal complaints. Rest of his review of systems unremarkable.   Past Medical History  Diagnosis Date  . Hyperlipidemia   . History of kidney stones   . BPH (benign prostatic hypertrophy)   . Bladder tumor     BLADDER NECK TUMOR  . Prostate nodule   . Renal  cyst, acquired   . Renal calculus, bilateral   . Hypogonadism male   . History of bladder cancer   . Nocturia   . History of skin cancer     EXCISION MALIGNANT NEOPLASM  . History of basal cell carcinoma excision     elbow  . At risk for sleep apnea     STOP-BANG= 4     SENT TO PCP 07-05-2014  :  Past Surgical History  Procedure Laterality Date  . Wisdom teeth extracted  2005    oral surgeon office- bp dropped- but recovered  . Transurethral resection of bladder tumor N/A 12/03/2013    Procedure: TRANSURETHRAL RESECTION OF BLADDER TUMOR (TURBT)AT BLADDER NECK 8CM, TRANSURETHRAL RESECTION OF PROSTATE;  Surgeon: Ailene Rud, MD;  Location: [facility];  Service: Urology;  Laterality: N/A;  . Lasik Bilateral 1990's  . Laparoscopic cholecystectomy  02-24-2009  . Hemorrhoid banding      MD OFFICE  . Tonsillectomy  as child  . Transurethral resection of bladder tumor with gyrus (turbt-gyrus) N/A 07/12/2014    Procedure: TRANSURETHRAL RESECTION OF BLADDER TUMOR WITH GYRUS (TURBT-GYRUS);  Surgeon: Ailene Rud, MD;  Location: [facility];  Service: Urology;  Laterality: N/A;  : Current Outpatient Prescriptions  Medication Sig Dispense Refill  . finasteride (PROSCAR) 5 MG tablet Take 5 mg by mouth every evening.      . lovastatin (MEVACOR) 20 MG tablet Take 20 mg by mouth every evening.      . Multiple Vitamin (MULTIVITAMIN) tablet  Take 1 tablet by mouth daily.       No current facility-administered medications for this visit.      Allergies  Allergen Reactions  . Hydrocodone Other (See Comments)    HALLUCINATIONS  . Lipitor [Atorvastatin] Other (See Comments)    "muscle soreness"  . Pravastatin Sodium Other (See Comments)    INCREASED CK  . Sulfa Antibiotics Rash  :  No family history on file.:  History   Social History  . Marital Status: Married    Spouse Name: N/A    Number of Children: N/A  . Years of Education: N/A   Occupational History   . Not on file.   Social History Main Topics  . Smoking status: Never Smoker   . Smokeless tobacco: Never Used  . Alcohol Use: 8.4 oz/week    14 Cans of beer per week     Comment: COUPLE  BEERS A DAY  . Drug Use: No  . Sexual Activity: Not on file   Other Topics Concern  . Not on file   Social History Narrative  . No narrative on file  :  Pertinent items are noted in HPI.  Exam: ECOG 0 Blood pressure 130/81, pulse 81, temperature 97.8 F (36.6 C), temperature source Oral, resp. rate 18, height 5\' 11"  (1.803 m), weight 193 lb 12.8 oz (87.907 kg), SpO2 96.00%. General appearance: alert and cooperative Head: Normocephalic, without obvious abnormality Throat: lips, mucosa, and tongue normal; teeth and gums normal Neck: no adenopathy Back: negative Resp: clear to auscultation bilaterally Cardio: regular rate and rhythm, S1, S2 normal, no murmur, click, rub or gallop GI: soft, non-tender; bowel sounds normal; no masses,  no organomegaly Extremities: extremities normal, atraumatic, no cyanosis or edema Pulses: 2+ and symmetric   Assessment and Plan:    68 year old gentleman with recurrent superficial bladder tumor initially diagnosed in December of 2014. He is status post TURBT in February of 2015 and subsequently BCG full dose with 6 treatments a week apart. He has recent recurrence with 5 superficial bladder tumors in September 2015. The natural course of this disease was discussed with the patient and his wife. Treatment options were also discussed. Options would include retreatment with BCG and interferon, intravesicular mitomycin C. as well as cystectomy. All these options were discussed today with risks and benefits. I believe that mitomycin-C is a reasonable option at this time and I discussed the risks and benefits of intravesicular mitomycin C. Complications that includes bladder irritation, urinary symptoms such as dysuria and rarely bladder trauma. I also discussed  complications associated with catheter insertion. He did require premedication with Valium which I instructed him to do is show up for his next treatment.  The plan is to proceed with total of 6 treatments of mitomycin-C and our referring him back to Dr. Gaynelle Arabian for repeat cystoscopy.

## 2014-08-09 NOTE — Telephone Encounter (Signed)
Delivered chart-08/09/14 °

## 2014-08-09 NOTE — Progress Notes (Signed)
Please see consult note.  

## 2014-08-09 NOTE — Telephone Encounter (Signed)
gv adn printed appt sched and avs for pt for OCT thru Dec...sed added tx.

## 2014-08-14 ENCOUNTER — Ambulatory Visit (HOSPITAL_BASED_OUTPATIENT_CLINIC_OR_DEPARTMENT_OTHER): Payer: Medicare Other

## 2014-08-14 VITALS — BP 108/72 | HR 66 | Temp 98.3°F | Resp 18

## 2014-08-14 DIAGNOSIS — Z5111 Encounter for antineoplastic chemotherapy: Secondary | ICD-10-CM

## 2014-08-14 DIAGNOSIS — C679 Malignant neoplasm of bladder, unspecified: Secondary | ICD-10-CM

## 2014-08-14 MED ORDER — MITOMYCIN CHEMO FOR BLADDER INSTILLATION 40 MG
40.0000 mg | Freq: Once | INTRAVENOUS | Status: AC
  Administered 2014-08-14: 40 mg via INTRAVESICAL
  Filled 2014-08-14: qty 80

## 2014-08-14 MED ORDER — PROCHLORPERAZINE MALEATE 10 MG PO TABS
10.0000 mg | ORAL_TABLET | Freq: Once | ORAL | Status: AC
  Administered 2014-08-14: 10 mg via ORAL

## 2014-08-14 MED ORDER — LIDOCAINE HCL 2 % EX GEL
Freq: Once | CUTANEOUS | Status: AC
  Administered 2014-08-14: 10 via URETHRAL
  Filled 2014-08-14: qty 20

## 2014-08-14 MED ORDER — PROCHLORPERAZINE MALEATE 10 MG PO TABS
ORAL_TABLET | ORAL | Status: AC
  Filled 2014-08-14: qty 1

## 2014-08-14 NOTE — Progress Notes (Signed)
1725-Foley catheter unclamped, draining clear but slightly blue/green-tinged urine.  1735-Catheter d/c'd without difficulty, patient tolerated well. 1745-patient discharged with spouse in stable condition, ambulatory, in no acute distress. Patient encouraged to increase po liquids and call clinic with any changes from baseline or urinary concerns. Pt and spouse verbalize understanding

## 2014-08-14 NOTE — Patient Instructions (Addendum)
[facility] Discharge Instructions for Patients Receiving Chemotherapy  Today you received the following chemotherapy agents Mutamycin.  To help prevent nausea and vomiting after your treatment, we encourage you to take your nausea medication ( Call MD office if needed)  If you develop nausea and vomiting that is not controlled by your nausea medication, call the clinic.   BELOW ARE SYMPTOMS THAT SHOULD BE REPORTED IMMEDIATELY:  *FEVER GREATER THAN 100.5 F  *CHILLS WITH OR WITHOUT FEVER  NAUSEA AND VOMITING THAT IS NOT CONTROLLED WITH YOUR NAUSEA MEDICATION  *UNUSUAL SHORTNESS OF BREATH  *UNUSUAL BRUISING OR BLEEDING  TENDERNESS IN MOUTH AND THROAT WITH OR WITHOUT PRESENCE OF ULCERS  *URINARY PROBLEMS  *BOWEL PROBLEMS  UNUSUAL RASH Items with * indicate a potential emergency and should be followed up as soon as possible.  Feel free to call the clinic you have any questions or concerns. The clinic phone number is [phone].    Mitomycin injection What is this medicine? MITOMYCIN (mye toe MYE sin) is a chemotherapy drug. This medicine is used to treat cancer of the stomach and pancreas. This medicine may be used for other purposes; ask your health care provider or pharmacist if you have questions. COMMON BRAND NAME(S): Mutamycin What should I tell my health care provider before I take this medicine? They need to know if you have any of these conditions: -anemia -bleeding disorder -infection (especially a virus infection such as chickenpox, cold sores, or herpes) -kidney disease -low blood counts like low platelets, red blood cells, white blood cells -recent radiation therapy -an unusual or allergic reaction to mitomycin, other chemotherapy agents, other medicines, foods, dyes, or preservatives -pregnant or trying to get pregnant -breast-feeding How should I use this medicine? This drug is given as an injection or infusion into a vein. It is  administered in a hospital or clinic by a specially trained health care professional. Talk to your pediatrician regarding the use of this medicine in children. Special care may be needed. Overdosage: If you think you have taken too much of this medicine contact a poison control center or emergency room at once. NOTE: This medicine is only for you. Do not share this medicine with others. What if I miss a dose? It is important not to miss your dose. Call your doctor or health care professional if you are unable to keep an appointment. What may interact with this medicine? -medicines to increase blood counts like filgrastim, pegfilgrastim, sargramostim -vaccines This list may not describe all possible interactions. Give your health care provider a list of all the medicines, herbs, non-prescription drugs, or dietary supplements you use. Also tell them if you smoke, drink alcohol, or use illegal drugs. Some items may interact with your medicine. What should I watch for while using this medicine? Your condition will be monitored carefully while you are receiving this medicine. You will need important blood work done while you are taking this medicine. This drug may make you feel generally unwell. This is not uncommon, as chemotherapy can affect healthy cells as well as cancer cells. Report any side effects. Continue your course of treatment even though you feel ill unless your doctor tells you to stop. Call your doctor or health care professional for advice if you get a fever, chills or sore throat, or other symptoms of a cold or flu. Do not treat yourself. This drug decreases your body's ability to fight infections. Try to avoid being around people who are sick. This medicine  may increase your risk to bruise or bleed. Call your doctor or health care professional if you notice any unusual bleeding. Be careful brushing and flossing your teeth or using a toothpick because you may get an infection or bleed  more easily. If you have any dental work done, tell your dentist you are receiving this medicine. Avoid taking products that contain aspirin, acetaminophen, ibuprofen, naproxen, or ketoprofen unless instructed by your doctor. These medicines may hide a fever. Do not become pregnant while taking this medicine. Women should inform their doctor if they wish to become pregnant or think they might be pregnant. There is a potential for serious side effects to an unborn child. Talk to your health care professional or pharmacist for more information. Do not breast-feed an infant while taking this medicine. What side effects may I notice from receiving this medicine? Side effects that you should report to your doctor or health care professional as soon as possible: -allergic reactions like skin rash, itching or hives, swelling of the face, lips, or tongue -low blood counts - this medicine may decrease the number of white blood cells, red blood cells and platelets. You may be at increased risk for infections and bleeding. -signs of infection - fever or chills, cough, sore throat, pain or difficulty passing urine -signs of decreased platelets or bleeding - bruising, pinpoint red spots on the skin, black, tarry stools, blood in the urine -signs of decreased red blood cells - unusually weak or tired, fainting spells, lightheadedness -breathing problems -changes in vision -chest pain -confusion -dry cough -high blood pressure -mouth sores -pain, swelling, redness at site where injected -pain, tingling, numbness in the hands or feet -seizures -swelling of the ankles, feet, hands -trouble passing urine or change in the amount of urine Side effects that usually do not require medical attention (report to your doctor or health care professional if they continue or are bothersome): -diarrhea -green to blue color of urine -hair loss -loss of appetite -nausea, vomiting This list may not describe all possible  side effects. Call your doctor for medical advice about side effects. You may report side effects to FDA at 1-800-FDA-1088. Where should I keep my medicine? This drug is given in a hospital or clinic and will not be stored at home. NOTE: This sheet is a summary. It may not cover all possible information. If you have questions about this medicine, talk to your doctor, pharmacist, or health care provider.  2015, [employer]. (2008-04-11 11:16:23)

## 2014-08-14 NOTE — Progress Notes (Signed)
1435 Per Protocol; 16 french Foley cath inserted without difficulty by Aldean Baker, NT with good urine return.  No c/o voiced by pt; no distress noted.

## 2014-08-21 ENCOUNTER — Ambulatory Visit (HOSPITAL_BASED_OUTPATIENT_CLINIC_OR_DEPARTMENT_OTHER): Payer: Medicare Other

## 2014-08-21 DIAGNOSIS — C679 Malignant neoplasm of bladder, unspecified: Secondary | ICD-10-CM | POA: Diagnosis not present

## 2014-08-21 DIAGNOSIS — Z5111 Encounter for antineoplastic chemotherapy: Secondary | ICD-10-CM | POA: Diagnosis not present

## 2014-08-21 MED ORDER — MITOMYCIN CHEMO FOR BLADDER INSTILLATION 40 MG
40.0000 mg | Freq: Once | INTRAVENOUS | Status: AC
  Administered 2014-08-21: 40 mg via INTRAVESICAL
  Filled 2014-08-21: qty 40

## 2014-08-21 MED ORDER — PROCHLORPERAZINE MALEATE 10 MG PO TABS
10.0000 mg | ORAL_TABLET | Freq: Once | ORAL | Status: AC
  Administered 2014-08-21: 10 mg via ORAL

## 2014-08-21 MED ORDER — PROCHLORPERAZINE MALEATE 10 MG PO TABS
ORAL_TABLET | ORAL | Status: AC
  Filled 2014-08-21: qty 1

## 2014-08-21 MED ORDER — SODIUM CHLORIDE 0.9 % IV SOLN: Freq: Once | INTRAVENOUS | Status: DC

## 2014-08-21 MED ORDER — LIDOCAINE HCL 2 % EX GEL
Freq: Once | CUTANEOUS | Status: AC
  Administered 2014-08-21: 10 via URETHRAL
  Filled 2014-08-21: qty 20

## 2014-08-21 NOTE — Patient Instructions (Signed)
[facility] Discharge Instructions for Patients Receiving Chemotherapy  Today you received the following chemotherapy agents: Mitomycin  To help prevent nausea and vomiting after your treatment, we encourage you to take your nausea medication as prescribed.   If you develop nausea and vomiting that is not controlled by your nausea medication, call the clinic.   BELOW ARE SYMPTOMS THAT SHOULD BE REPORTED IMMEDIATELY:  *FEVER GREATER THAN 100.5 F  *CHILLS WITH OR WITHOUT FEVER  NAUSEA AND VOMITING THAT IS NOT CONTROLLED WITH YOUR NAUSEA MEDICATION  *UNUSUAL SHORTNESS OF BREATH  *UNUSUAL BRUISING OR BLEEDING  TENDERNESS IN MOUTH AND THROAT WITH OR WITHOUT PRESENCE OF ULCERS  *URINARY PROBLEMS  *BOWEL PROBLEMS  UNUSUAL RASH Items with * indicate a potential emergency and should be followed up as soon as possible.  Feel free to call the clinic you have any questions or concerns. The clinic phone number is [phone].

## 2014-08-21 NOTE — Progress Notes (Signed)
Foley catheter placed after one unsuccessful attempt. Amber urine obtained. Chemotherapy instilled into bladder at 1345. Pt demonstrated understanding of instructions. Turned without prompting Q 15 minutes. Denied any pain or urgency during procedure. @1605  Foley DC'd with approximately 835mL blue tinged urine. Pt given instructions to push PO fluids. He voiced understanding.

## 2014-08-28 ENCOUNTER — Ambulatory Visit (HOSPITAL_BASED_OUTPATIENT_CLINIC_OR_DEPARTMENT_OTHER): Payer: Medicare Other

## 2014-08-28 ENCOUNTER — Other Ambulatory Visit: Payer: Self-pay | Admitting: *Deleted

## 2014-08-28 DIAGNOSIS — C675 Malignant neoplasm of bladder neck: Secondary | ICD-10-CM

## 2014-08-28 DIAGNOSIS — Z5111 Encounter for antineoplastic chemotherapy: Secondary | ICD-10-CM | POA: Diagnosis not present

## 2014-08-28 DIAGNOSIS — C679 Malignant neoplasm of bladder, unspecified: Secondary | ICD-10-CM

## 2014-08-28 MED ORDER — LIDOCAINE HCL 2 % EX GEL
Freq: Once | CUTANEOUS | Status: AC
  Administered 2014-08-28: 10 via URETHRAL
  Filled 2014-08-28: qty 20

## 2014-08-28 MED ORDER — PROCHLORPERAZINE MALEATE 10 MG PO TABS
10.0000 mg | ORAL_TABLET | Freq: Once | ORAL | Status: AC
  Administered 2014-08-28: 10 mg via ORAL

## 2014-08-28 MED ORDER — SODIUM CHLORIDE 0.9 % IV SOLN: Freq: Once | INTRAVENOUS | Status: DC

## 2014-08-28 MED ORDER — PROCHLORPERAZINE MALEATE 10 MG PO TABS
ORAL_TABLET | ORAL | Status: AC
  Filled 2014-08-28: qty 1

## 2014-08-28 MED ORDER — MITOMYCIN CHEMO FOR BLADDER INSTILLATION 40 MG
40.0000 mg | Freq: Once | INTRAVENOUS | Status: AC
  Administered 2014-08-28: 40 mg via INTRAVESICAL
  Filled 2014-08-28: qty 40

## 2014-08-28 NOTE — Progress Notes (Signed)
16 Fr Foley catheter inserted approximately 1310.  Mitomycin infused through foley 1325, using proper PPE.  Mitomycin dwelled 2 hours with patient turning self every 15 minutes.  Approximately 1525, 800cc blue tinged urine drained from foley.  Foley d/c'd using proper PPE.  Pt instructed to push fluids, pt d/c'd to home.

## 2014-09-04 ENCOUNTER — Ambulatory Visit (HOSPITAL_BASED_OUTPATIENT_CLINIC_OR_DEPARTMENT_OTHER): Payer: Medicare Other

## 2014-09-04 DIAGNOSIS — C679 Malignant neoplasm of bladder, unspecified: Secondary | ICD-10-CM

## 2014-09-04 DIAGNOSIS — Z5111 Encounter for antineoplastic chemotherapy: Secondary | ICD-10-CM

## 2014-09-04 DIAGNOSIS — C675 Malignant neoplasm of bladder neck: Secondary | ICD-10-CM

## 2014-09-04 MED ORDER — LIDOCAINE HCL 2 % EX GEL
Freq: Once | CUTANEOUS | Status: AC
  Administered 2014-09-04: 10 via URETHRAL
  Filled 2014-09-04: qty 20

## 2014-09-04 MED ORDER — PROCHLORPERAZINE MALEATE 10 MG PO TABS
ORAL_TABLET | ORAL | Status: AC
  Filled 2014-09-04: qty 1

## 2014-09-04 MED ORDER — MITOMYCIN CHEMO FOR BLADDER INSTILLATION 40 MG
40.0000 mg | Freq: Once | INTRAVENOUS | Status: AC
  Administered 2014-09-04: 40 mg via INTRAVESICAL
  Filled 2014-09-04: qty 40

## 2014-09-04 MED ORDER — PROCHLORPERAZINE MALEATE 10 MG PO TABS
10.0000 mg | ORAL_TABLET | Freq: Once | ORAL | Status: AC
  Administered 2014-09-04: 10 mg via ORAL

## 2014-09-04 NOTE — Progress Notes (Signed)
14 Fr Foley catheter inserted approximately 1300. Mitomycin infused through foley 1315, using proper PPE. Mitomycin dwelled 2 hours with patient turning self every 15 minutes. @ approximately 4pm, 700 cc  blue tinged urine drained from foley. Foley d/c'd using proper PPE.  Tolerated very well. No c/o . Pt instructed to push fluids, pt d/c'd to home.

## 2014-09-04 NOTE — Patient Instructions (Addendum)
[facility] Discharge Instructions for Patients Receiving Chemotherapy  Today you received the following chemotherapy agents mitomycin via bladder irrigation.  To help prevent nausea and vomiting after your treatment, we encourage you to take your nausea medication compazine 10 mg every 6 hours as needed.   If you develop nausea and vomiting that is not controlled by your nausea medication, call the clinic.   BELOW ARE SYMPTOMS THAT SHOULD BE REPORTED IMMEDIATELY:  *FEVER GREATER THAN 100.5 F  *CHILLS WITH OR WITHOUT FEVER  NAUSEA AND VOMITING THAT IS NOT CONTROLLED WITH YOUR NAUSEA MEDICATION  *UNUSUAL SHORTNESS OF BREATH  *UNUSUAL BRUISING OR BLEEDING  TENDERNESS IN MOUTH AND THROAT WITH OR WITHOUT PRESENCE OF ULCERS  *URINARY PROBLEMS  *BOWEL PROBLEMS  UNUSUAL RASH Items with * indicate a potential emergency and should be followed up as soon as possible.  Feel free to call the clinic you have any questions or concerns. The clinic phone number is [phone]   INCREASE FLUIDS OVER THE NEXT 24-48 HOURS. PLEASE CALL WITH ANY BLOOD IN URINE.

## 2014-09-11 ENCOUNTER — Ambulatory Visit (HOSPITAL_BASED_OUTPATIENT_CLINIC_OR_DEPARTMENT_OTHER): Payer: Medicare Other

## 2014-09-11 DIAGNOSIS — C679 Malignant neoplasm of bladder, unspecified: Secondary | ICD-10-CM | POA: Diagnosis not present

## 2014-09-11 DIAGNOSIS — Z5111 Encounter for antineoplastic chemotherapy: Secondary | ICD-10-CM

## 2014-09-11 MED ORDER — PROCHLORPERAZINE MALEATE 10 MG PO TABS
10.0000 mg | ORAL_TABLET | Freq: Once | ORAL | Status: AC
  Administered 2014-09-11: 10 mg via ORAL

## 2014-09-11 MED ORDER — PROCHLORPERAZINE MALEATE 10 MG PO TABS
ORAL_TABLET | ORAL | Status: AC
  Filled 2014-09-11: qty 1

## 2014-09-11 MED ORDER — MITOMYCIN CHEMO FOR BLADDER INSTILLATION 40 MG
40.0000 mg | Freq: Once | INTRAVENOUS | Status: AC
  Administered 2014-09-11: 40 mg via INTRAVESICAL
  Filled 2014-09-11: qty 40

## 2014-09-11 MED ORDER — LIDOCAINE HCL 2 % EX GEL
Freq: Once | CUTANEOUS | Status: AC
  Administered 2014-09-11: 10 via URETHRAL
  Filled 2014-09-11: qty 20

## 2014-09-11 NOTE — Progress Notes (Signed)
#  75F Foley catheter inserted into urethra without difficulty.  At 1245hr, Abelina Bachelor instill chemotherapy via foley catheter.  354ml of clear urine drained from foley bag prior to tmt.  Pt. education reviewed with turning q 15 minutes beginning with prone position for the next 2 hrs.  He is to report any unusual discomfort to nurse ASAP.  Comfortable at this time. HL

## 2014-09-11 NOTE — Patient Instructions (Addendum)
[facility] Discharge Instructions for Patients Receiving Chemotherapy  Today you received the following chemotherapy agents: Mitomycin. Please increase fluid intake over the next 72 hours.  Sit down when using the toilet and close lid before flushing and flush twice for the next 72 hrs.   To help prevent nausea and vomiting after your treatment, we encourage you to take your nausea medication as prescribed.   If you develop nausea and vomiting that is not controlled by your nausea medication, call the clinic.   BELOW ARE SYMPTOMS THAT SHOULD BE REPORTED IMMEDIATELY:  *FEVER GREATER THAN 100.5 F  *CHILLS WITH OR WITHOUT FEVER  NAUSEA AND VOMITING THAT IS NOT CONTROLLED WITH YOUR NAUSEA MEDICATION  *UNUSUAL SHORTNESS OF BREATH  *UNUSUAL BRUISING OR BLEEDING  TENDERNESS IN MOUTH AND THROAT WITH OR WITHOUT PRESENCE OF ULCERS  *URINARY PROBLEMS  *BOWEL PROBLEMS  UNUSUAL RASH Items with * indicate a potential emergency and should be followed up as soon as possible.  Feel free to call the clinic you have any questions or concerns. The clinic phone number is [phone].

## 2014-09-11 NOTE — Progress Notes (Signed)
1345: Pt continues to turn every 15 minutes as ordered.  Has no complaints, will continue to monitor patient.  1445: Foley catheter unclamped and drained.   1505: 900 cc of blue-gray colored urine drained from foley catheter, placed in biohazard bag. Catheter removed.  Pt discharged home in no distress, ambulatory.

## 2014-09-17 DIAGNOSIS — H2513 Age-related nuclear cataract, bilateral: Secondary | ICD-10-CM | POA: Diagnosis not present

## 2014-09-18 ENCOUNTER — Ambulatory Visit (HOSPITAL_BASED_OUTPATIENT_CLINIC_OR_DEPARTMENT_OTHER): Payer: Medicare Other

## 2014-09-18 ENCOUNTER — Ambulatory Visit (HOSPITAL_BASED_OUTPATIENT_CLINIC_OR_DEPARTMENT_OTHER): Payer: Medicare Other | Admitting: Oncology

## 2014-09-18 VITALS — BP 113/78 | HR 86 | Temp 97.6°F | Resp 18 | Wt 197.1 lb

## 2014-09-18 DIAGNOSIS — C679 Malignant neoplasm of bladder, unspecified: Secondary | ICD-10-CM

## 2014-09-18 DIAGNOSIS — L821 Other seborrheic keratosis: Secondary | ICD-10-CM | POA: Diagnosis not present

## 2014-09-18 DIAGNOSIS — L814 Other melanin hyperpigmentation: Secondary | ICD-10-CM | POA: Diagnosis not present

## 2014-09-18 DIAGNOSIS — L570 Actinic keratosis: Secondary | ICD-10-CM | POA: Diagnosis not present

## 2014-09-18 DIAGNOSIS — D225 Melanocytic nevi of trunk: Secondary | ICD-10-CM | POA: Diagnosis not present

## 2014-09-18 MED ORDER — MITOMYCIN CHEMO FOR BLADDER INSTILLATION 40 MG
40.0000 mg | Freq: Once | INTRAVENOUS | Status: AC
  Administered 2014-09-18: 40 mg via INTRAVESICAL
  Filled 2014-09-18: qty 40

## 2014-09-18 MED ORDER — PROCHLORPERAZINE MALEATE 10 MG PO TABS
ORAL_TABLET | ORAL | Status: AC
  Filled 2014-09-18: qty 1

## 2014-09-18 MED ORDER — SODIUM CHLORIDE 0.9 % IV SOLN: Freq: Once | INTRAVENOUS | Status: DC

## 2014-09-18 MED ORDER — PROCHLORPERAZINE MALEATE 10 MG PO TABS
10.0000 mg | ORAL_TABLET | Freq: Once | ORAL | Status: AC
  Administered 2014-09-18: 10 mg via ORAL

## 2014-09-18 MED ORDER — LIDOCAINE HCL 2 % EX GEL
Freq: Once | CUTANEOUS | Status: AC
Start: 2014-09-18 — End: 2014-09-18
  Administered 2014-09-18: 10 via URETHRAL
  Filled 2014-09-18: qty 20

## 2014-09-18 NOTE — Progress Notes (Signed)
[facility], [place] inserted 14 Fr foley catheter.  175 ml of clear yellow urine emptied from foley bag prior to chemotherapy administration.  Foley clamped and chemo instilled over 5 minutes via foley catheter.  Pt instructed to turn every 15 minutes x 2 hours, verbalized understanding.  1345: 575 ml of blue/gray tinged urine drained.  Foley catheter D/C'd.  Pt given discharge instructions. Discharged in no distress.

## 2014-09-18 NOTE — Progress Notes (Signed)
Hematology and Oncology Follow Up Visit  Jeffrey Horn [id number] Jul 14, 1946 68 y.o. 09/18/2014 10:17 AM Jeffrey Horn, MDLittle, [name], MD   Principle Diagnosis: 68 year old gentleman with recurrent superficial bladder tumor diagnosed in December 2014.   Prior Therapy: He is status post TURBT in February 2015 and followed by BCG treatments.  Current therapy: He is status post mitomycin-C intravesically completed in November 2015.  Interim History:  [name] presents today for a follow-up visit. He completed his intravesical treatments of mitomycin-C without any complications. He does not report any dysuria or hematuria. Does not report any urinary frequency or urgency. He continues to have excellent performance status and functionality. He does not report any headaches or blurry vision or double vision or syncope. He did not report any chest pain or palpitation. He does not report any orthopnea, PND or leg edema. He does not report any cough, shortness of breath or hemoptysis. He does not report any nausea or vomiting. He does not report any change in his bowel habits. He does not report any frequency urgency or hesitancy. Has not reported any skeletal complaints. Rest of his review of systems unremarkable.   Medications: I have reviewed the patient's current medications.  Current Outpatient Prescriptions  Medication Sig Dispense Refill  . finasteride (PROSCAR) 5 MG tablet Take 5 mg by mouth every evening.    . lovastatin (MEVACOR) 20 MG tablet Take 20 mg by mouth every evening.    . Multiple Vitamin (MULTIVITAMIN) tablet Take 1 tablet by mouth daily.     No current facility-administered medications for this visit.     Allergies:  Allergies  Allergen Reactions  . Hydrocodone Other (See Comments)    HALLUCINATIONS  . Lipitor [Atorvastatin] Other (See Comments)    "muscle soreness"  . Pravastatin Sodium Other (See Comments)    INCREASED CK  . Sulfa Antibiotics Rash     Past Medical History, Surgical history, Social history, and Family History were reviewed and updated.   Physical Exam: There were no vitals taken for this visit. ECOG: 0 General appearance: alert and cooperative Head: Normocephalic, without obvious abnormality Neck: no adenopathy Lymph nodes: Cervical, supraclavicular, and axillary nodes normal. Heart:regular rate and rhythm, S1, S2 normal, no murmur, click, rub or gallop Lung:chest clear, no wheezing, rales, normal symmetric air entry Abdomin: soft, non-tender, without masses or organomegaly EXT:no erythema, induration, or nodules   Lab Results: Lab Results  Component Value Date   WBC 14.4* 02/24/2009   HGB 14.4 07/12/2014   HCT 35.3* 02/24/2009   MCV 93.0 02/24/2009   PLT 162 02/24/2009     Chemistry      Component Value Date/Time   NA 133* 02/23/2009 1734   K 3.7 02/23/2009 1734   CL 103 02/23/2009 1734   CO2 27 02/23/2009 1734   BUN 16 02/23/2009 1734   CREATININE 0.96 02/23/2009 1734      Component Value Date/Time   CALCIUM 9.9 02/23/2009 1734   ALKPHOS 56 02/23/2009 1734   AST 24 02/23/2009 1734   ALT 21 02/23/2009 1734   BILITOT 0.9 02/23/2009 1734          Impression and Plan:  68 year old gentleman with recurrent superficial bladder tumor initially diagnosed in December of 2014. He is status post TURBT in February of 2015 and subsequently BCG full dose with 6 treatments a week apart. He has recent recurrence with 5 superficial bladder tumors in September 2015.  He is status post 6 treatments of mitomycin-C intravesically completed in  November 2015 without any complications. He is scheduled to have a repeat cystoscopy in the near future with Dr. Gaynelle Arabian for surveillance purposes.  I will be happy to see him in the future as needed.   PYYFRT,MYTRZ, MD 12/2/201510:17 AM

## 2014-09-18 NOTE — Patient Instructions (Addendum)
[facility] Discharge Instructions for Patients Receiving Chemotherapy  Today you received the following chemotherapy agents Mitomycin.  Please increase oral fluid intake for the next 72 hours. Make sure you sit down to urinate and flush toilet with lid down twice for the next 72 hours.  To help prevent nausea and vomiting after your treatment, we encourage you to take your nausea medication   If you develop nausea and vomiting that is not controlled by your nausea medication, call the clinic.   BELOW ARE SYMPTOMS THAT SHOULD BE REPORTED IMMEDIATELY:  *FEVER GREATER THAN 100.5 F  *CHILLS WITH OR WITHOUT FEVER  NAUSEA AND VOMITING THAT IS NOT CONTROLLED WITH YOUR NAUSEA MEDICATION  *UNUSUAL SHORTNESS OF BREATH  *UNUSUAL BRUISING OR BLEEDING  TENDERNESS IN MOUTH AND THROAT WITH OR WITHOUT PRESENCE OF ULCERS  *URINARY PROBLEMS  *BOWEL PROBLEMS  UNUSUAL RASH Items with * indicate a potential emergency and should be followed up as soon as possible.  Feel free to call the clinic you have any questions or concerns. The clinic phone number is [phone].

## 2014-10-21 DIAGNOSIS — C671 Malignant neoplasm of dome of bladder: Secondary | ICD-10-CM | POA: Diagnosis not present

## 2014-10-25 ENCOUNTER — Other Ambulatory Visit: Payer: Self-pay | Admitting: Urology

## 2014-10-29 ENCOUNTER — Encounter (HOSPITAL_BASED_OUTPATIENT_CLINIC_OR_DEPARTMENT_OTHER): Payer: Self-pay | Admitting: *Deleted

## 2014-10-29 NOTE — Progress Notes (Signed)
NPO AFTER MN. ARRIVE AT 0900.  NEEDS HG. 

## 2014-10-30 DIAGNOSIS — M25512 Pain in left shoulder: Secondary | ICD-10-CM | POA: Diagnosis not present

## 2014-11-01 ENCOUNTER — Ambulatory Visit (HOSPITAL_BASED_OUTPATIENT_CLINIC_OR_DEPARTMENT_OTHER)
Admission: RE | Admit: 2014-11-01 | Discharge: 2014-11-01 | Disposition: A | Discharge status: Home / Self Care | Payer: Medicare Other | Source: Ambulatory Visit | Attending: Urology | Admitting: Urology

## 2014-11-01 ENCOUNTER — Ambulatory Visit (HOSPITAL_BASED_OUTPATIENT_CLINIC_OR_DEPARTMENT_OTHER): Payer: Medicare Other | Admitting: Anesthesiology

## 2014-11-01 ENCOUNTER — Encounter (HOSPITAL_BASED_OUTPATIENT_CLINIC_OR_DEPARTMENT_OTHER)
Admission: RE | Disposition: A | Discharge status: Home / Self Care | Payer: Self-pay | Source: Ambulatory Visit | Attending: Urology

## 2014-11-01 ENCOUNTER — Encounter (HOSPITAL_BASED_OUTPATIENT_CLINIC_OR_DEPARTMENT_OTHER): Payer: Self-pay | Admitting: *Deleted

## 2014-11-01 DIAGNOSIS — C679 Malignant neoplasm of bladder, unspecified: Secondary | ICD-10-CM | POA: Diagnosis not present

## 2014-11-01 DIAGNOSIS — E785 Hyperlipidemia, unspecified: Secondary | ICD-10-CM | POA: Diagnosis not present

## 2014-11-01 DIAGNOSIS — C671 Malignant neoplasm of dome of bladder: Secondary | ICD-10-CM | POA: Diagnosis not present

## 2014-11-01 DIAGNOSIS — Z85828 Personal history of other malignant neoplasm of skin: Secondary | ICD-10-CM | POA: Diagnosis not present

## 2014-11-01 DIAGNOSIS — C670 Malignant neoplasm of trigone of bladder: Secondary | ICD-10-CM | POA: Diagnosis not present

## 2014-11-01 DIAGNOSIS — C672 Malignant neoplasm of lateral wall of bladder: Secondary | ICD-10-CM | POA: Diagnosis not present

## 2014-11-01 DIAGNOSIS — N400 Enlarged prostate without lower urinary tract symptoms: Secondary | ICD-10-CM | POA: Diagnosis not present

## 2014-11-01 DIAGNOSIS — N281 Cyst of kidney, acquired: Secondary | ICD-10-CM | POA: Diagnosis not present

## 2014-11-01 DIAGNOSIS — N3020 Other chronic cystitis without hematuria: Secondary | ICD-10-CM | POA: Diagnosis not present

## 2014-11-01 DIAGNOSIS — N3289 Other specified disorders of bladder: Secondary | ICD-10-CM | POA: Diagnosis not present

## 2014-11-01 DIAGNOSIS — Z8551 Personal history of malignant neoplasm of bladder: Secondary | ICD-10-CM | POA: Diagnosis not present

## 2014-11-01 DIAGNOSIS — Z87442 Personal history of urinary calculi: Secondary | ICD-10-CM | POA: Insufficient documentation

## 2014-11-01 DIAGNOSIS — R310 Gross hematuria: Secondary | ICD-10-CM | POA: Diagnosis not present

## 2014-11-01 HISTORY — PX: TRANSURETHRAL RESECTION OF BLADDER TUMOR: SHX2575

## 2014-11-01 HISTORY — DX: Malignant neoplasm of bladder, unspecified: C67.9

## 2014-11-01 LAB — POCT HEMOGLOBIN-HEMACUE: HEMOGLOBIN: 14.8 g/dL (ref 13.0–17.0)

## 2014-11-01 SURGERY — TURBT (TRANSURETHRAL RESECTION OF BLADDER TUMOR)
Anesthesia: General | Site: Bladder

## 2014-11-01 MED ORDER — LACTATED RINGERS IV SOLN
INTRAVENOUS | Status: DC
  Administered 2014-11-01 (×2): via INTRAVENOUS
  Filled 2014-11-01: qty 1000

## 2014-11-01 MED ORDER — URELLE 81 MG PO TABS
1.0000 | ORAL_TABLET | Freq: Three times a day (TID) | ORAL | Status: DC
Start: 2014-11-01 — End: 2015-01-29

## 2014-11-01 MED ORDER — ACETAMINOPHEN 10 MG/ML IV SOLN
INTRAVENOUS | Status: DC | PRN
  Administered 2014-11-01: 1000 mg via INTRAVENOUS

## 2014-11-01 MED ORDER — SODIUM CHLORIDE 0.9 % IR SOLN
Status: DC | PRN
  Administered 2014-11-01: 3000 mL
  Administered 2014-11-01: 3500 mL

## 2014-11-01 MED ORDER — EPHEDRINE SULFATE 50 MG/ML IJ SOLN
INTRAMUSCULAR | Status: DC | PRN
  Administered 2014-11-01: 10 mg via INTRAVENOUS

## 2014-11-01 MED ORDER — MELOXICAM 15 MG PO TABS
15.0000 mg | ORAL_TABLET | Freq: Every day | ORAL | Status: DC
Start: 2014-11-01 — End: 2015-01-29

## 2014-11-01 MED ORDER — CEFAZOLIN SODIUM-DEXTROSE 2-3 GM-% IV SOLR
INTRAVENOUS | Status: AC
  Filled 2014-11-01: qty 50

## 2014-11-01 MED ORDER — TRIMETHOPRIM 100 MG PO TABS: 100.0000 mg | ORAL_TABLET | ORAL | Status: DC

## 2014-11-01 MED ORDER — MIDAZOLAM HCL 2 MG/2ML IJ SOLN
INTRAMUSCULAR | Status: AC
  Filled 2014-11-01: qty 2

## 2014-11-01 MED ORDER — DEXAMETHASONE SODIUM PHOSPHATE 4 MG/ML IJ SOLN
INTRAMUSCULAR | Status: DC | PRN
  Administered 2014-11-01: 10 mg via INTRAVENOUS

## 2014-11-01 MED ORDER — PROPOFOL 10 MG/ML IV BOLUS
INTRAVENOUS | Status: DC | PRN
  Administered 2014-11-01: 170 mg via INTRAVENOUS

## 2014-11-01 MED ORDER — LIDOCAINE HCL (CARDIAC) 20 MG/ML IV SOLN
INTRAVENOUS | Status: DC | PRN
  Administered 2014-11-01: 60 mg via INTRAVENOUS

## 2014-11-01 MED ORDER — FENTANYL CITRATE 0.05 MG/ML IJ SOLN
25.0000 ug | INTRAMUSCULAR | Status: DC | PRN
  Filled 2014-11-01: qty 1

## 2014-11-01 MED ORDER — FENTANYL CITRATE 0.05 MG/ML IJ SOLN
INTRAMUSCULAR | Status: DC | PRN
  Administered 2014-11-01: 25 ug via INTRAVENOUS
  Administered 2014-11-01: 50 ug via INTRAVENOUS
  Administered 2014-11-01 (×3): 25 ug via INTRAVENOUS

## 2014-11-01 MED ORDER — FENTANYL CITRATE 0.05 MG/ML IJ SOLN
INTRAMUSCULAR | Status: AC
  Filled 2014-11-01: qty 4

## 2014-11-01 MED ORDER — MIDAZOLAM HCL 5 MG/5ML IJ SOLN
INTRAMUSCULAR | Status: DC | PRN
  Administered 2014-11-01: 2 mg via INTRAVENOUS

## 2014-11-01 MED ORDER — CEFAZOLIN SODIUM-DEXTROSE 2-3 GM-% IV SOLR
2.0000 g | INTRAVENOUS | Status: AC
  Administered 2014-11-01: 2 g via INTRAVENOUS
  Filled 2014-11-01: qty 50

## 2014-11-01 MED ORDER — MITOMYCIN CHEMO FOR BLADDER INSTILLATION 40 MG
40.0000 mg | Freq: Once | INTRAVENOUS | Status: AC
  Administered 2014-11-01: 40 mg via INTRAVESICAL
  Filled 2014-11-01: qty 40

## 2014-11-01 MED ORDER — PROMETHAZINE HCL 25 MG/ML IJ SOLN
6.2500 mg | INTRAMUSCULAR | Status: DC | PRN
  Filled 2014-11-01: qty 1

## 2014-11-01 SURGICAL SUPPLY — 19 items
BAG DRAIN URO-CYSTO SKYTR STRL (DRAIN) ×2 IMPLANT
BAG DRN UROCATH (DRAIN) ×1
CANISTER SUCT LVC 12 LTR MEDI- (MISCELLANEOUS) ×5 IMPLANT
CATH FOLEY 2WAY SLVR  5CC 18FR (CATHETERS) ×1
CATH FOLEY 2WAY SLVR 5CC 18FR (CATHETERS) IMPLANT
CLOTH BEACON ORANGE TIMEOUT ST (SAFETY) ×2 IMPLANT
DRAPE CAMERA CLOSED 9X96 (DRAPES) ×2 IMPLANT
ELECT RESECT VAPORIZE 12D CBL (ELECTRODE) ×1 IMPLANT
GLOVE BIO SURGEON STRL SZ7.5 (GLOVE) ×2 IMPLANT
GLOVE BIOGEL PI IND STRL 7.5 (GLOVE) IMPLANT
GLOVE BIOGEL PI INDICATOR 7.5 (GLOVE) ×2
GLOVE SURG SS PI 7.5 STRL IVOR (GLOVE) ×2 IMPLANT
GOWN STRL REUS W/ TWL XL LVL3 (GOWN DISPOSABLE) IMPLANT
GOWN STRL REUS W/TWL XL LVL3 (GOWN DISPOSABLE) ×5 IMPLANT
HOLDER FOLEY CATH W/STRAP (MISCELLANEOUS) ×1 IMPLANT
IV NS IRRIG 3000ML ARTHROMATIC (IV SOLUTION) ×2 IMPLANT
PACK CYSTO (CUSTOM PROCEDURE TRAY) ×2 IMPLANT
PLUG CATH AND CAP STER (CATHETERS) ×1 IMPLANT
SET ASPIRATION TUBING (TUBING) ×1 IMPLANT

## 2014-11-01 NOTE — Op Note (Signed)
Pre-operative diagnosis :   Recurrent Bladder tumor  Postoperative diagnosis:  Same Operation:  Cystoscopy, cold cub bladder bx,TUR-BT , cauterization of bx site   Surgeon:  S. Gaynelle Arabian, MD  First assistant:  none  Anesthesia:  General LMA  Preparation:  After appropriate preanesthesia, the patient was brought to the operating room, placed on the operating table in the dorsal supine position. General LMA anesthesia was introduced, and the patient was replaced in the dorsal lithotomy position. The history was reviewed. The armband was double checked.  Review history:  . Malignant neoplasm of anterior wall of urinary bladder (C67.3)  Assessed By: Hillary Bow (Urology); Last Assessed: 19 Jul 2014 2. Malignant neoplasm of dome of urinary bladder (C67.1)  Assessed By: Carolan Clines (Urology); Last Assessed: 21 Oct 2014 3. Malignant neoplasm of lateral wall of urinary bladder (C67.2) 4. Malignant neoplasm of trigone of urinary bladder (C67.0) 5. Stage I papillary adenocarcinoma of bladder (C67.9)  Assessed By: Hillary Bow (Urology); Last Assessed: 19 Jul 2014  History of Present Illness 69 yo married male returns today for a 3 mo cystoscopy for hx of bladder cancer. He is s/p Mitomycin-C chemo intravesical instillations x 6 weeks, finished on 09/18/14. S/p cysto/TURBT on 07/12/14. He is s/p 6 treatments of BCG instillations (last one was 02/20/14). He is s/p TURBT & TURP on 12/03/13. He has current IPSS=14.     Hx of gross hematuria. Originally referred by Dr. Rex Kras for further evaluation of asymptomatic gross hematuria & passing blood clots. He states that this happened on 10/05/13 & lasted x 5 days. His urine has since been clear. IPSS= 1 only.    Statement of  Likelihood of Success: Excellent. TIME-OUT observed.:  Procedure:  Cystourethroscopy to Compass, and on the left side, an area of bladder tumor was identified which had bled. This was cold cup biopsied and excised. A  second area of tumor was identified and this was cold cup excised. Both areas were cauterized using the gyrus instruments. The bladder was drained of fluid, and an 26 [place] Foley with 10 mL in the balloon was placed. The patient was awakened, and taken to recovery room in good condition. He received IV antibiotic.

## 2014-11-01 NOTE — Transfer of Care (Signed)
Immediate Anesthesia Transfer of Care Note  Patient: Jeffrey Horn  Procedure(s) Performed: Procedure(s) (LRB): TRANSURETHRAL RESECTION OF BLADDER TUMOR (TURBT) (N/A)  Patient Location: PACU  Anesthesia Type: General  Level of Consciousness: awake, alert  and oriented  Airway & Oxygen Therapy: Patient Spontanous Breathing and Patient connected to face mask oxygen  Post-op Assessment: Report given to PACU RN and Post -op Vital signs reviewed and stable  Post vital signs: Reviewed and stable  Complications: No apparent anesthesia complications

## 2014-11-01 NOTE — Anesthesia Procedure Notes (Signed)
Procedure Name: LMA Insertion Date/Time: 11/01/2014 11:03 AM Performed by: Mechele Claude Pre-anesthesia Checklist: Patient identified, Emergency Drugs available, Suction available and Patient being monitored Patient Re-evaluated:Patient Re-evaluated prior to inductionOxygen Delivery Method: Circle System Utilized Preoxygenation: Pre-oxygenation with 100% oxygen Intubation Type: IV induction Ventilation: Mask ventilation without difficulty LMA: LMA inserted LMA Size: 5.0 Number of attempts: 1 Airway Equipment and Method: bite block Placement Confirmation: positive ETCO2 Tube secured with: Tape Dental Injury: Teeth and Oropharynx as per pre-operative assessment

## 2014-11-01 NOTE — Discharge Instructions (Addendum)
Bladder Cancer Bladder cancer is an abnormal growth of tissue in your bladder. Your bladder is the balloon-like sac in your pelvis. It collects and stores urine that comes from the kidneys through the ureters. The bladder wall is made of layers. If cancer spreads into these layers and through the wall of the bladder, it becomes more difficult to treat.  There are four stages of bladder cancer:  Stage I. Cancer at this stage occurs in the bladder's inner lining but has not invaded the muscular bladder wall.  Stage II. At this stage, cancer has invaded the bladder wall but is still confined to the bladder.  Stage III. By this stage, the cancer cells have spread through the bladder wall to surrounding tissue. They may also have spread to the prostate in men or the uterus or vagina in women.  Stage IV. By this stage, cancer cells may have spread to the lymph nodes and other organs, such as your lungs, bones, or liver. RISK FACTORS Although the cause of bladder cancer is not known, the following risk factors can increase your chances of getting bladder cancer:   Smoking.   Occupational exposures, such as rubber, leather, textile, dyes, chemicals, and paint.  Being white.  Age.   Being male.   Having chronic bladder inflammation.   Having a bladder cancer history.   Having a family history of bladder cancer (heredity).   Having had chemotherapy or radiation therapy to the pelvis.   Being exposed to arsenic.  SYMPTOMS   Blood in the urine.   Pain with urination.   Frequent bladder or urine infections.  Increase in urgency and frequency of urination. DIAGNOSIS  Your health care provider may suspect bladder cancer based on your description of urinary symptoms or based on the finding of blood or infection in the urine (especially if this has recurred several times). Other tests or procedures that may be performed include:   A narrow tube being inserted into your bladder  through your urethra (cystoscopy) in order to view the lining of your bladder for tumors.   A biopsy to sample the tumor to see if cancer is present.  If cancer is present, it will then be staged to determine its severity and extent. It is important to know how deeply into the bladder wall the cancer has grown and whether the cancer has spread to any other parts of your body. Staging may require blood tests or special scans such as a CT scan, MRI, bone scan, or chest X-ray.  TREATMENT  Once your cancer has been diagnosed and staged, you should discuss a treatment plan with your health care provider. Based on the stage of the cancer, one treatment or a combination of treatments may be recommended. The most common forms of treatment are:   Surgery. Procedures that may be done include transurethral resection and cystectomy.  Radiation therapy. This is infrequently used to treat bladder cancer.   Chemotherapy. During this treatment, drugs are used to kill cancer cells.  Immunotherapy. This is usually administered directly into the bladder. HOME CARE INSTRUCTIONS  Take medicines only as directed by your health care provider.   Maintain a healthy diet.   Consider joining a support group. This may help you learn to cope with the stress of having bladder cancer.   Seek advice to help you manage treatment side effects.   Keep all follow-up visits as directed by your health care provider.   Inform your cancer specialist if you are  admitted to the hospital.  [place] IF:  There is blood in your urine.  You have symptoms of a urinary tract infection. These include:  Tiredness.  Shakiness.  Weakness.  Muscle aches.  Abdominal pain.  Frequent and intense urge to urinate (in young women).  Burning feeling in the bladder or urethra during urination (in young women). SEEK IMMEDIATE MEDICAL CARE IF:  You are unable to urinate. Document Released: 10/07/2003 Document  Revised: 02/18/2014 Document Reviewed: 03/27/2013 [facility] Patient Information 2015 [place], [place]. This information is not intended to replace advice given to you by your health care provider. Make sure you discuss any questions you have with your health care provider.         Post Anesthesia Home Care Instructions  Activity: Get plenty of rest for the remainder of the day. A responsible adult should stay with you for 24 hours following the procedure.  For the next 24 hours, DO NOT: -Drive a car -Paediatric nurse -Drink alcoholic beverages -Take any medication unless instructed by your physician -Make any legal decisions or sign important papers.  Meals: Start with liquid foods such as gelatin or soup. Progress to regular foods as tolerated. Avoid greasy, spicy, heavy foods. If nausea and/or vomiting occur, drink only clear liquids until the nausea and/or vomiting subsides. Call your physician if vomiting continues.  Special Instructions/Symptoms: Your throat may feel dry or sore from the anesthesia or the breathing tube placed in your throat during surgery. If this causes discomfort, gargle with warm salt water. The discomfort should disappear within 24 hours.                                            Transurethral Procedure  Medications: Resume all your other meds from home  Activity: 1. No heavy lifting > 10 pounds for 2 weeks. 2. No sexual activity for 2 weeks. 3. No strenuous activity for 2 weeks. 4. No driving while on narcotic pain medications. 5. Drink plenty of water. 6. Continue to walk at home - you can still get blood clots when you are at home so keep     active but don't over do it. 7. Your urine may have some blood in it - make sure you drink plenty of water. Call or           come to the ER immediately if you catheter stops draining or you are unable to urinate.  Bathing: You can shower. You can take a bath unless you have a foley catheter in  place.  Signs / Symptoms to call: 1. Call if you have a fever greater than 101.5  2. Uncontrolled nausea / vomiting, uncontrolled pain / dizziness, unable to urinate, leg         swelling / leg pain, or any other concerns.   You can reach [place] at [phone]

## 2014-11-01 NOTE — Anesthesia Postprocedure Evaluation (Signed)
  Anesthesia Post-op Note  Patient: Jeffrey Horn  Procedure(s) Performed: Procedure(s) (LRB): TRANSURETHRAL RESECTION OF BLADDER TUMOR (TURBT) (N/A)  Patient Location: PACU  Anesthesia Type: general  Level of Consciousness: awake and alert   Airway and Oxygen Therapy: Patient Spontanous Breathing  Post-op Pain: mild  Post-op Assessment: Post-op Vital signs reviewed, Patient's Cardiovascular Status Stable, Respiratory Function Stable, Patent Airway and No signs of Nausea or vomiting  Last Vitals:  Filed Vitals:   11/01/14 1415  BP: 128/75  Pulse: 71  Temp: 36.8 C  Resp: 18    Post-op Vital Signs: stable   Complications: No apparent anesthesia complications

## 2014-11-01 NOTE — H&P (Signed)
Reason For Visit 3 mo cysto   Active Problems Problems  1. Malignant neoplasm of anterior wall of urinary bladder (C67.3)   Assessed By: Hillary Bow (Urology); Last Assessed: 19 Jul 2014 2. Malignant neoplasm of dome of urinary bladder (C67.1)   Assessed By: Carolan Clines (Urology); Last Assessed: 21 Oct 2014 3. Malignant neoplasm of lateral wall of urinary bladder (C67.2) 4. Malignant neoplasm of trigone of urinary bladder (C67.0) 5. Stage I papillary adenocarcinoma of bladder (C67.9)   Assessed By: Hillary Bow (Urology); Last Assessed: 19 Jul 2014  History of Present Illness 69 yo married male returns today for a 3 mo cystoscopy for hx of bladder cancer. He is s/p Mitomycin-C chemo intravesical instillations x 6 weeks, finished on 09/18/14. S/p cysto/TURBT on 07/12/14. He is s/p 6 treatments of BCG instillations (last one was 02/20/14). He is s/p TURBT & TURP on 12/03/13. He has current IPSS=14.     Hx of gross hematuria. Originally referred by Dr. Rex Kras for further evaluation of asymptomatic gross hematuria & passing blood clots. He states that this happened on 10/05/13 & lasted x 5 days. His urine has since been clear. IPSS= 1 only.       He had a CT on 10/05/13 that showed 6.7cm low density lesion on the Rt renal lower pole, that mostly likely is a renal cyst. It also showed that he had bilateral kidney stones. Stones in the Lt kidney are larger than the Rt.      Low dose ASA, but off since Dec 17th. No tobacco (neg history). Employed as Games developer.   Past Medical History Problems  1. History of BPH (benign prostatic hypertrophy) (N40.0) 2. History of fibromyalgia (Z87.39) 3. History of hyperlipidemia (Z86.39) 4. History of malignant neoplasm of skin (Z85.828) 5. History of renal calculi (Z87.442) 6. History of Low testosterone (E29.1)  Surgical History Problems  1. History of Cholecystectomy 2. History of Cystoscopy With Fulguration Large Lesion  (Over 5cm) 3. History of Cystoscopy With Fulguration Medium Lesion (2-5cm) 4. History of Eye Surgery 5. History of Hemorrhoidectomy 6. History of Transurethral Resection Of Prostate (TURP)  Current Meds 1. AndroGel 50 MG/5GM (1%) Transdermal Gel;  Therapy: [id number] to Recorded 2. Diazepam 10 MG Oral Tablet; take 1 tablet by mouth 1 hour prior to  procedure;  Therapy: [id number] to (Evaluate:14Nov2015)  Requested for: [id number];  Last Rx:04Nov2015 Ordered 3. Finasteride 5 MG Oral Tablet; TAKE 1 TABLET DAILY AS DIRECTED;  Therapy: [phone] to (Evaluate:03Jun2016)  Requested for: [phone];  Last Rx:09Jun2015 Ordered 4. Lovastatin 10 MG Oral Tablet;  Therapy: 09MBO1499 to Recorded  Allergies Medication  1. Hydrocodone-Acetaminophen CAPS 2. Sulfa Drugs 3. Lovastatin TABS 4. Pravastatin Sodium TABS  Family History Problems  1. Family history of lung cancer (Z80.1) : Father  Social History Problems  1. Alcohol use   2 per day 2. Caffeine use (F15.90)   6-8 per day 3. Father deceased 65. Married 5. Mother deceased 75. Never a smoker 7. Occupation   Games developer 8. Three children   daughters  Review of Systems Genitourinary, constitutional, skin, eye, otolaryngeal, hematologic/lymphatic, cardiovascular, pulmonary, endocrine, musculoskeletal, gastrointestinal, neurological and psychiatric system(s) were reviewed and pertinent findings if present are noted and are otherwise negative.  Genitourinary: nocturia, hematuria and erectile dysfunction, but no urinary frequency, no feelings of urinary urgency, no difficulty starting the urinary stream, urine stream is not weak, urinary stream does not start and stop, no incomplete emptying of bladder and initiating urination does  not require straining.  Gastrointestinal: no nausea, no vomiting, no heartburn, no diarrhea and no constipation.  Constitutional: no fever, no night sweats, not feeling tired (fatigue) and no recent  weight loss.  Integumentary: no new skin rashes or lesions and no pruritus.  Eyes: no blurred vision and no diplopia.  ENT: no sore throat and no sinus problems.  Hematologic/Lymphatic: no tendency to easily bruise and no swollen glands.  Cardiovascular: no chest pain and no leg swelling.  Respiratory: no shortness of breath and no cough.  Endocrine: no polydipsia.  Musculoskeletal: joint pain, but no back pain.  Neurological: no headache and no dizziness.  Psychiatric: no anxiety and no depression.    Vitals Vital Signs [Data Includes: Last 1 Day]  Recorded: [id number] 02:39PM  Blood Pressure: 132 / 89 Temperature: 97.6 F Heart Rate: 76  Physical Exam Constitutional: Well nourished and well developed . No acute distress.  ENT:. The ears and nose are normal in appearance.  Neck: The appearance of the neck is normal and no neck mass is present.  Pulmonary: No respiratory distress and normal respiratory rhythm and effort.  Cardiovascular: Heart rate and rhythm are normal . No peripheral edema.  Abdomen: The abdomen is soft and nontender. No masses are palpated. No CVA tenderness. No hernias are palpable. No hepatosplenomegaly noted.  Rectal: Rectal exam demonstrates normal sphincter tone, no tenderness and no masses. Estimated prostate size is 3+. The prostate has a palpable nodule involving the right, mid aspect of the prostate which appears to be confined within the prostate capsule and is not tender. The left seminal vesicle is nonpalpable. The right seminal vesicle is nonpalpable. The perineum is normal on inspection.  Genitourinary: Examination of the penis demonstrates no discharge, no masses, no lesions and a normal meatus. The scrotum is without lesions. The right epididymis is palpably normal and non-tender. The left epididymis is palpably normal and non-tender. The right testis is non-tender and without masses. The left testis is non-tender and without masses.  Lymphatics: The  femoral and inguinal nodes are not enlarged or tender.  Skin: Normal skin turgor, no visible rash and no visible skin lesions.  Neuro/Psych:. Mood and affect are appropriate.    Results/Data Urine [Data Includes: Last 1 Day]   [id number]  COLOR YELLOW   APPEARANCE CLEAR   SPECIFIC GRAVITY <1.005   pH 6.0   GLUCOSE NEG mg/dL  BILIRUBIN NEG   KETONE NEG mg/dL  BLOOD NEG   PROTEIN NEG mg/dL  UROBILINOGEN 0.2 mg/dL  NITRITE NEG   LEUKOCYTE ESTERASE NEG    Procedure  Procedure: Cystoscopy  Chaperone Present: Hillary Bow, CMA.  Indication: History of Urothelial Carcinoma.  Informed Consent: Risks, benefits, and potential adverse events were discussed and informed consent was obtained from the patient.  Anesthesia:. Local anesthesia was administered intraurethrally with 2% lidocaine jelly.  Antibiotic prophylaxis: Ciprofloxacin.  Procedure Note:  Urethral meatus:. No abnormalities.  Anterior urethra: No abnormalities.  Prostatic urethra: No abnormalities . No intravesical median lobe was visualized.  Bladder: Visulization was clear. The ureteral orifices were in the normal anatomic position bilaterally. Examination of the bladder demonstrated no clot within the bladder and no trabeculation no erythematous mucosa and no cellules. A papillary tumor was seen in the bladder measuring approximately 2 cm in size. This tumor was located on the anterior aspect, near the dome of the bladder. The patient tolerated the procedure well.  Complications: None.    Assessment Assessed  1. Bladder mass (N32.89) 2. Malignant neoplasm  of dome of urinary bladder (C67.1)  Recurrent bladder cancer, despite Mitomycin C/ He needs TUR-BT, and Mitomycin-/c.   Plan Health Maintenance  1. UA With REFLEX; [Do Not Release]; Status:Resulted - Requires  Verification;   Done: 53YYF1102 02:01PM Malignant neoplasm of dome of urinary bladder  2. Follow-up Schedule Surgery Office  Follow-up  Status: Hold For -   Appointment  Requested for: [id number] Stage I papillary adenocarcinoma of bladder  3. Renew: Oxycodone-Acetaminophen 5-325 MG Oral Tablet; TAKE 1  TABLET 3 times daily for kidney stone pain  TUR-BT and Mitomycin-C.   Discussion/Summary cc: Hulan Fess, MD     Signatures Electronically signed by : Carolan Clines, M.D.; Oct 21 2014  5:15PM EST

## 2014-11-01 NOTE — Anesthesia Preprocedure Evaluation (Signed)
Anesthesia Evaluation  Patient identified by MRN, date of birth, ID band Patient awake    Reviewed: Allergy & Precautions, NPO status , Patient's Chart, lab work & pertinent test results  Airway Mallampati: II  TM Distance: >3 FB Neck ROM: Full    Dental no notable dental hx.    Pulmonary neg pulmonary ROS,  breath sounds clear to auscultation  Pulmonary exam normal       Cardiovascular negative cardio ROS  Rhythm:Regular Rate:Normal     Neuro/Psych negative neurological ROS  negative psych ROS   GI/Hepatic negative GI ROS, Neg liver ROS,   Endo/Other  negative endocrine ROS  Renal/GU Renal disease  negative genitourinary   Musculoskeletal negative musculoskeletal ROS (+)   Abdominal   Peds negative pediatric ROS (+)  Hematology negative hematology ROS (+)   Anesthesia Other Findings   Reproductive/Obstetrics negative OB ROS                             Anesthesia Physical Anesthesia Plan  ASA: II  Anesthesia Plan: General   Post-op Pain Management:    Induction: Intravenous  Airway Management Planned: LMA  Additional Equipment:   Intra-op Plan:   Post-operative Plan: Extubation in OR  Informed Consent: I have reviewed the patients History and Physical, chart, labs and discussed the procedure including the risks, benefits and alternatives for the proposed anesthesia with the patient or authorized representative who has indicated his/her understanding and acceptance.   Dental advisory given  Plan Discussed with: CRNA  Anesthesia Plan Comments:         Anesthesia Quick Evaluation

## 2014-11-01 NOTE — Interval H&P Note (Signed)
History and Physical Interval Note:  11/01/2014 8:15 AM  Jeffrey Horn  has presented today for surgery, with the diagnosis of BLADDER CANCER  The various methods of treatment have been discussed with the patient and family. After consideration of risks, benefits and other options for treatment, the patient has consented to  Procedure(s): TRANSURETHRAL RESECTION OF BLADDER TUMOR (TURBT) (N/A) as a surgical intervention .  The patient's history has been reviewed, patient examined, no change in status, stable for surgery.  I have reviewed the patient's chart and labs.  Questions were answered to the patient's satisfaction.     Carolan Clines I

## 2014-11-05 ENCOUNTER — Encounter (HOSPITAL_BASED_OUTPATIENT_CLINIC_OR_DEPARTMENT_OTHER): Payer: Self-pay | Admitting: Urology

## 2014-11-15 DIAGNOSIS — Z79899 Other long term (current) drug therapy: Secondary | ICD-10-CM | POA: Diagnosis not present

## 2014-11-15 DIAGNOSIS — E780 Pure hypercholesterolemia: Secondary | ICD-10-CM | POA: Diagnosis not present

## 2014-11-15 DIAGNOSIS — E291 Testicular hypofunction: Secondary | ICD-10-CM | POA: Diagnosis not present

## 2014-11-15 DIAGNOSIS — N402 Nodular prostate without lower urinary tract symptoms: Secondary | ICD-10-CM | POA: Diagnosis not present

## 2014-11-20 DIAGNOSIS — Z79899 Other long term (current) drug therapy: Secondary | ICD-10-CM | POA: Diagnosis not present

## 2014-11-20 DIAGNOSIS — C679 Malignant neoplasm of bladder, unspecified: Secondary | ICD-10-CM | POA: Diagnosis not present

## 2014-11-20 DIAGNOSIS — R7301 Impaired fasting glucose: Secondary | ICD-10-CM | POA: Diagnosis not present

## 2014-11-20 DIAGNOSIS — Z0000 Encounter for general adult medical examination without abnormal findings: Secondary | ICD-10-CM | POA: Diagnosis not present

## 2014-11-20 DIAGNOSIS — L570 Actinic keratosis: Secondary | ICD-10-CM | POA: Diagnosis not present

## 2014-11-20 DIAGNOSIS — E780 Pure hypercholesterolemia: Secondary | ICD-10-CM | POA: Diagnosis not present

## 2014-11-20 DIAGNOSIS — N200 Calculus of kidney: Secondary | ICD-10-CM | POA: Diagnosis not present

## 2014-11-20 DIAGNOSIS — E291 Testicular hypofunction: Secondary | ICD-10-CM | POA: Diagnosis not present

## 2014-11-27 DIAGNOSIS — C672 Malignant neoplasm of lateral wall of bladder: Secondary | ICD-10-CM | POA: Diagnosis not present

## 2014-12-02 ENCOUNTER — Telehealth: Payer: Self-pay | Admitting: Oncology

## 2014-12-02 ENCOUNTER — Other Ambulatory Visit: Payer: Self-pay | Admitting: *Deleted

## 2014-12-02 NOTE — Telephone Encounter (Signed)
, °

## 2014-12-03 ENCOUNTER — Telehealth: Payer: Self-pay | Admitting: Oncology

## 2014-12-03 ENCOUNTER — Ambulatory Visit (HOSPITAL_BASED_OUTPATIENT_CLINIC_OR_DEPARTMENT_OTHER): Payer: Medicare Other | Admitting: Oncology

## 2014-12-03 VITALS — BP 127/82 | HR 109 | Temp 97.5°F | Resp 18 | Ht 71.0 in | Wt 198.9 lb

## 2014-12-03 DIAGNOSIS — C679 Malignant neoplasm of bladder, unspecified: Secondary | ICD-10-CM | POA: Diagnosis not present

## 2014-12-03 NOTE — Progress Notes (Signed)
Hematology and Oncology Follow Up Visit  Jeffrey Horn [id number] [birth year] 69 y.o. 12/03/2014 1:29 PM Gennette Pac, MDLittle, [name], MD   Principle Diagnosis: 69 year old gentleman with recurrent superficial bladder tumor diagnosed in December 2014.   Prior Therapy:  He is status post TURBT in February 2015 and followed by BCG treatments. He is status post mitomycin-C intravesically completed in November 2015.  Current therapy: Observation and surveillance and under consideration for retreatment of mitomycin-C.  Interim History:  Jeffrey Horn presents today for a follow-up visit. Since the last visit, he completed intravesicular therapy of mitomycin-C and have been receiving surveillance cystoscopies. His last cystoscopy under the care of Dr. Gaynelle Arabian on 11/01/2014 showed recurrence of one of the tumors in the left bladder wall. He is completely asymptomatic at this point. He does not report any dysuria or hematuria. Does not report any urinary frequency or urgency. He continues to have excellent performance status and functionality. He does not report any headaches or blurry vision or double vision or syncope. He did not report any chest pain or palpitation. He does not report any orthopnea, PND or leg edema. He does not report any cough, shortness of breath or hemoptysis. He does not report any nausea or vomiting. He does not report any change in his bowel habits. He does not report any frequency urgency or hesitancy. Has not reported any skeletal complaints. Rest of his review of systems unremarkable.   Medications: I have reviewed the patient's current medications.  Current Outpatient Prescriptions  Medication Sig Dispense Refill  . diazepam (VALIUM) 10 MG tablet Take 10 mg by mouth as needed. Before proceedure  0  . finasteride (PROSCAR) 5 MG tablet Take 5 mg by mouth every evening.    . lovastatin (MEVACOR) 20 MG tablet Take 20 mg by mouth every evening.    . meloxicam (MOBIC)  15 MG tablet Take 1 tablet (15 mg total) by mouth daily. 30 tablet 1  . Meth-Hyo-M Bl-Na Phos-Ph Sal (URIBEL) 118 MG CAPS Take 1 capsule by mouth 3 (three) times daily. As needed  3  . Multiple Vitamin (MULTIVITAMIN) tablet Take 1 tablet by mouth daily.    Marland Kitchen trimethoprim (TRIMPEX) 100 MG tablet Take 1 tablet (100 mg total) by mouth 1 day or 1 dose. 30 tablet 1  . URELLE (URELLE/URISED) 81 MG TABS tablet Take 1 tablet (81 mg total) by mouth 3 (three) times daily. 30 each 2   No current facility-administered medications for this visit.     Allergies:  Allergies  Allergen Reactions  . Hydrocodone Other (See Comments)    HALLUCINATIONS  . Lipitor [Atorvastatin] Other (See Comments)    "muscle soreness"  . Pravastatin Sodium Other (See Comments)    INCREASED CK  . Sulfa Antibiotics Rash    Past Medical History, Surgical history, Social history, and Family History were reviewed and updated.   Physical Exam: Blood pressure 127/82, pulse 109, temperature 97.5 F (36.4 C), temperature source Oral, resp. rate 18, height 5\' 11"  (1.803 m), weight 198 lb 14.4 oz (90.22 kg), SpO2 96 %. ECOG: 0 General appearance: alert and cooperative Head: Normocephalic, without obvious abnormality Neck: no adenopathy Lymph nodes: Cervical, supraclavicular, and axillary nodes normal. Heart:regular rate and rhythm, S1, S2 normal, no murmur, click, rub or gallop Lung:chest clear, no wheezing, rales, normal symmetric air entry Abdomin: soft, non-tender, without masses or organomegaly EXT:no erythema, induration, or nodules   Lab Results: Lab Results  Component Value Date   WBC 14.4* 02/24/2009  HGB 14.8 11/01/2014   HCT 35.3* 02/24/2009   MCV 93.0 02/24/2009   PLT 162 02/24/2009     Chemistry      Component Value Date/Time   NA 133* 02/23/2009 1734   K 3.7 02/23/2009 1734   CL 103 02/23/2009 1734   CO2 27 02/23/2009 1734   BUN 16 02/23/2009 1734   CREATININE 0.96 02/23/2009 1734       Component Value Date/Time   CALCIUM 9.9 02/23/2009 1734   ALKPHOS 56 02/23/2009 1734   AST 24 02/23/2009 1734   ALT 21 02/23/2009 1734   BILITOT 0.9 02/23/2009 1734          Impression and Plan:  69 year old gentleman with recurrent superficial bladder tumor initially diagnosed in December of 2014. He is status post TURBT in February of 2015 and subsequently BCG full dose with 6 treatments a week apart. He had recurrence with 5 superficial bladder tumors in September 2015.  He is status post 6 treatments of mitomycin-C intravesically completed in November 2015 without any complications. He is status post repeat cystoscopy in January 2016 which showed recurrence of his tumor but certainly improvement in the number and the size of the previous tumors. He was referred to me by Dr. Gaynelle Arabian for retreatment of mitomycin-C.  Risks and benefits of this treatment was discussed again. Complications include catheter insertion related complications such as trauma, bleeding among others. Complications from the drug itself can cause bladder irritation, adhesions and rarely systemic absorption of this drug. The benefit would certainly include improvement in his chances of bladder preservation and cancer control.  After discussing the risks and benefits she is agreeable to proceed we anticipate starting around 12/18/2014 which close to 6 weeks from his cystoscopy.  All questions were answered today to his satisfaction.   [facility], MD 2/16/20161:29 PM

## 2014-12-03 NOTE — Telephone Encounter (Signed)
gv adn printed appt scheda dna vs for pt for March and April....sed added tx.

## 2014-12-11 DIAGNOSIS — M25512 Pain in left shoulder: Secondary | ICD-10-CM | POA: Diagnosis not present

## 2014-12-18 ENCOUNTER — Ambulatory Visit (HOSPITAL_BASED_OUTPATIENT_CLINIC_OR_DEPARTMENT_OTHER): Payer: Medicare Other

## 2014-12-18 VITALS — BP 100/76 | HR 80 | Temp 97.8°F | Resp 20

## 2014-12-18 DIAGNOSIS — C679 Malignant neoplasm of bladder, unspecified: Secondary | ICD-10-CM | POA: Diagnosis not present

## 2014-12-18 DIAGNOSIS — Z5111 Encounter for antineoplastic chemotherapy: Secondary | ICD-10-CM

## 2014-12-18 MED ORDER — MITOMYCIN CHEMO FOR BLADDER INSTILLATION 40 MG
40.0000 mg | Freq: Once | INTRAVENOUS | Status: AC
  Administered 2014-12-18: 40 mg via INTRAVESICAL
  Filled 2014-12-18: qty 40

## 2014-12-18 MED ORDER — ONDANSETRON HCL 8 MG PO TABS
ORAL_TABLET | ORAL | Status: AC
  Filled 2014-12-18: qty 1

## 2014-12-18 MED ORDER — ONDANSETRON 8 MG/50ML IVPB (CHCC): 8.0000 mg | Freq: Once | INTRAVENOUS | Status: DC

## 2014-12-18 MED ORDER — DEXAMETHASONE SODIUM PHOSPHATE 10 MG/ML IJ SOLN: 10.0000 mg | Freq: Once | INTRAMUSCULAR | Status: DC

## 2014-12-18 MED ORDER — ONDANSETRON HCL 8 MG PO TABS
8.0000 mg | ORAL_TABLET | Freq: Once | ORAL | Status: AC
  Administered 2014-12-18: 8 mg via ORAL

## 2014-12-18 MED ORDER — LIDOCAINE HCL 2 % EX GEL
Freq: Once | CUTANEOUS | Status: AC
  Administered 2014-12-18: 10 via URETHRAL
  Filled 2014-12-18: qty 20

## 2014-12-18 NOTE — Patient Instructions (Addendum)
  [facility] Discharge Instructions for Patients Receiving Chemotherapy  Today you received the following chemotherapy agents:  Mitomycin  To help prevent nausea and vomiting after your treatment, we encourage you to take your nausea medication as ordered per MD.   If you develop nausea and vomiting that is not controlled by your nausea medication, call the clinic.   BELOW ARE SYMPTOMS THAT SHOULD BE REPORTED IMMEDIATELY:  *FEVER GREATER THAN 100.5 F  *CHILLS WITH OR WITHOUT FEVER  NAUSEA AND VOMITING THAT IS NOT CONTROLLED WITH YOUR NAUSEA MEDICATION  *UNUSUAL SHORTNESS OF BREATH  *UNUSUAL BRUISING OR BLEEDING  TENDERNESS IN MOUTH AND THROAT WITH OR WITHOUT PRESENCE OF ULCERS  *URINARY PROBLEMS  *BOWEL PROBLEMS  UNUSUAL RASH Items with * indicate a potential emergency and should be followed up as soon as possible.  Feel free to call the clinic you have any questions or concerns. The clinic phone number is [phone].   Increase fluids for the next 48-72 hours.  Sit with urination. Double flush with toilet lid down after urination.  Call with any urinary bleeding.

## 2014-12-18 NOTE — Progress Notes (Signed)
No need for labs with today's treatment per Dr. Alen Blew.  Change pre meds to Zofran 8 mg PO only per Dr. Alen Blew.  1045-95F foley catheter inserted without difficulty per R. Bobbye Charleston NT.  [phone] ml clear, yellow urine drained from foley catheter.  1117-Mitomycin given via foley catheter over 5 minutes without difficulty.  Pt with no complaints. Pt instructed to change position from stomach, to right side, to back, to left side every 15 minutes.  Pt verbalizes an understanding of position change and has no questions at this time.  1330-1000 ml blue tinged urine drained from foley catheter.  1400-Foley catheter removed without difficulty.  1405-Pt discharged to home with no questions at this time.  Intravesicular chemo precautions reviewed with pt.  Teach back done.

## 2014-12-25 ENCOUNTER — Ambulatory Visit (HOSPITAL_BASED_OUTPATIENT_CLINIC_OR_DEPARTMENT_OTHER): Payer: Medicare Other

## 2014-12-25 DIAGNOSIS — Z5111 Encounter for antineoplastic chemotherapy: Secondary | ICD-10-CM

## 2014-12-25 DIAGNOSIS — C679 Malignant neoplasm of bladder, unspecified: Secondary | ICD-10-CM | POA: Diagnosis not present

## 2014-12-25 MED ORDER — ONDANSETRON HCL 8 MG PO TABS
8.0000 mg | ORAL_TABLET | Freq: Once | ORAL | Status: AC
  Administered 2014-12-25: 8 mg via ORAL

## 2014-12-25 MED ORDER — MITOMYCIN CHEMO FOR BLADDER INSTILLATION 40 MG
40.0000 mg | Freq: Once | INTRAVENOUS | Status: AC
  Administered 2014-12-25: 40 mg via INTRAVESICAL
  Filled 2014-12-25: qty 40

## 2014-12-25 MED ORDER — LIDOCAINE HCL 2 % EX GEL
Freq: Once | CUTANEOUS | Status: AC
  Administered 2014-12-25: 10 via URETHRAL
  Filled 2014-12-25: qty 20

## 2014-12-25 MED ORDER — ONDANSETRON HCL 8 MG PO TABS
ORAL_TABLET | ORAL | Status: AC
  Filled 2014-12-25: qty 1

## 2014-12-25 NOTE — Progress Notes (Signed)
1020-60F foley inserted by Tandy Gaw NT and Jac Canavan RN without difficulty.  100 ml yellow and clear urine return.  1125-Mitomycin given per Lanora Manis RN without difficulty over 5 minutes.  Pt instructed to change position every 15 minutes.  Pt verbalizes an understanding to turn from right side, to back, to left side to abdomen for 2 hours.  1350-Foley d/c'd without difficulty.  Approximately 400 ml blue tinged urine in bag.  Urine bag placed in black bin.  1400-Pt discharged to home.  Intravesicular chemo precautions reviewed with pt and he has no questions at this time.  Teach back done.

## 2014-12-25 NOTE — Patient Instructions (Signed)
[facility] Discharge Instructions for Patients Receiving Chemotherapy  Today you received the following chemotherapy agents:  Mitomycin  To help prevent nausea and vomiting after your treatment, we encourage you to take your nausea medication as ordered per MD.   If you develop nausea and vomiting that is not controlled by your nausea medication, call the clinic.   BELOW ARE SYMPTOMS THAT SHOULD BE REPORTED IMMEDIATELY:  *FEVER GREATER THAN 100.5 F  *CHILLS WITH OR WITHOUT FEVER  NAUSEA AND VOMITING THAT IS NOT CONTROLLED WITH YOUR NAUSEA MEDICATION  *UNUSUAL SHORTNESS OF BREATH  *UNUSUAL BRUISING OR BLEEDING  TENDERNESS IN MOUTH AND THROAT WITH OR WITHOUT PRESENCE OF ULCERS  *URINARY PROBLEMS  *BOWEL PROBLEMS  UNUSUAL RASH Items with * indicate a potential emergency and should be followed up as soon as possible.  Feel free to call the clinic you have any questions or concerns. The clinic phone number is [phone].

## 2015-01-01 ENCOUNTER — Ambulatory Visit (HOSPITAL_BASED_OUTPATIENT_CLINIC_OR_DEPARTMENT_OTHER): Payer: Medicare Other

## 2015-01-01 DIAGNOSIS — Z5111 Encounter for antineoplastic chemotherapy: Secondary | ICD-10-CM | POA: Diagnosis not present

## 2015-01-01 DIAGNOSIS — C679 Malignant neoplasm of bladder, unspecified: Secondary | ICD-10-CM | POA: Diagnosis not present

## 2015-01-01 MED ORDER — ONDANSETRON HCL 8 MG PO TABS
ORAL_TABLET | ORAL | Status: AC
  Filled 2015-01-01: qty 1

## 2015-01-01 MED ORDER — MITOMYCIN CHEMO FOR BLADDER INSTILLATION 40 MG
40.0000 mg | Freq: Once | INTRAVENOUS | Status: AC
  Administered 2015-01-01: 40 mg via INTRAVESICAL
  Filled 2015-01-01: qty 40

## 2015-01-01 MED ORDER — ONDANSETRON HCL 8 MG PO TABS
8.0000 mg | ORAL_TABLET | Freq: Once | ORAL | Status: AC
  Administered 2015-01-01: 8 mg via ORAL

## 2015-01-01 MED ORDER — LIDOCAINE HCL 2 % EX GEL
Freq: Once | CUTANEOUS | Status: AC
  Administered 2015-01-01: 10 via URETHRAL
  Filled 2015-01-01: qty 20

## 2015-01-01 NOTE — Progress Notes (Signed)
1030-16 F foley catheter inserted by Tandy Gaw NT with assist of Rosalio Macadamia RN.  Pt tolerated catheter insertion with no difficulties or complaints.

## 2015-01-01 NOTE — Patient Instructions (Addendum)
[facility] Discharge Instructions for Patients Receiving Chemotherapy  Today you received the following chemotherapy agents:  Mitomycin  To help prevent nausea and vomiting after your treatment, we encourage you to take your nausea medication as ordered per MD.   If you develop nausea and vomiting that is not controlled by your nausea medication, call the clinic.   BELOW ARE SYMPTOMS THAT SHOULD BE REPORTED IMMEDIATELY:  *FEVER GREATER THAN 100.5 F  *CHILLS WITH OR WITHOUT FEVER  NAUSEA AND VOMITING THAT IS NOT CONTROLLED WITH YOUR NAUSEA MEDICATION  *UNUSUAL SHORTNESS OF BREATH  *UNUSUAL BRUISING OR BLEEDING  TENDERNESS IN MOUTH AND THROAT WITH OR WITHOUT PRESENCE OF ULCERS  *URINARY PROBLEMS  *BOWEL PROBLEMS  UNUSUAL RASH Items with * indicate a potential emergency and should be followed up as soon as possible.  Feel free to call the clinic you have any questions or concerns. The clinic phone number is [phone].  Please show the [facility] at check to the Emergency Department and triage nurse.   When urinating, please sit down to void.  Close toilet lid when flushing and flush toilet twice.

## 2015-01-01 NOTE — Progress Notes (Signed)
1345-Foley catheter d/c'd with no difficulties.  Pt tolerated well with no complaints.  450 ml blue tinged urine in foley bag and placed in black bin.  Pt instructed on discharge instructions regarding receiving intravesicular chemo.  Teach back done.  Pt has no questions at this time.

## 2015-01-01 NOTE — Progress Notes (Signed)
Intravesicular chemo procedure completed by Lavella Lemons whitlock RN without issues. Pt. tolerated procedure well.  176ml of clear yellow urine drained from foley pre chemo.  Pt. reminded to turn every 15 mins x 2hr and repeated instructions to RN.  Pt. also reminded to call for help if he experiences any lower abd or back discomfort.  HL

## 2015-01-08 ENCOUNTER — Ambulatory Visit (HOSPITAL_BASED_OUTPATIENT_CLINIC_OR_DEPARTMENT_OTHER): Payer: Medicare Other

## 2015-01-08 DIAGNOSIS — C679 Malignant neoplasm of bladder, unspecified: Secondary | ICD-10-CM | POA: Diagnosis not present

## 2015-01-08 DIAGNOSIS — Z5111 Encounter for antineoplastic chemotherapy: Secondary | ICD-10-CM | POA: Diagnosis not present

## 2015-01-08 MED ORDER — ONDANSETRON HCL 8 MG PO TABS
8.0000 mg | ORAL_TABLET | Freq: Once | ORAL | Status: AC
  Administered 2015-01-08: 8 mg via ORAL

## 2015-01-08 MED ORDER — LIDOCAINE HCL 2 % EX GEL
Freq: Once | CUTANEOUS | Status: AC
  Administered 2015-01-08: 10 via URETHRAL
  Filled 2015-01-08: qty 20

## 2015-01-08 MED ORDER — MITOMYCIN CHEMO FOR BLADDER INSTILLATION 40 MG
40.0000 mg | Freq: Once | INTRAVENOUS | Status: AC
  Administered 2015-01-08: 40 mg via INTRAVESICAL
  Filled 2015-01-08: qty 40

## 2015-01-08 NOTE — Patient Instructions (Addendum)
[facility] Discharge Instructions for Patients Receiving Chemotherapy  Today you received the following chemotherapy agents:  Mitomycin  To help prevent nausea and vomiting after your treatment, we encourage you to take your nausea medication as ordered per MD.   If you develop nausea and vomiting that is not controlled by your nausea medication, call the clinic.   BELOW ARE SYMPTOMS THAT SHOULD BE REPORTED IMMEDIATELY:  *FEVER GREATER THAN 100.5 F  *CHILLS WITH OR WITHOUT FEVER  NAUSEA AND VOMITING THAT IS NOT CONTROLLED WITH YOUR NAUSEA MEDICATION  *UNUSUAL SHORTNESS OF BREATH  *UNUSUAL BRUISING OR BLEEDING  TENDERNESS IN MOUTH AND THROAT WITH OR WITHOUT PRESENCE OF ULCERS  *URINARY PROBLEMS  *BOWEL PROBLEMS  UNUSUAL RASH Items with * indicate a potential emergency and should be followed up as soon as possible.  Feel free to call the clinic you have any questions or concerns. The clinic phone number is [phone].  Please show the [facility] at check-in to the Emergency Department and triage nurse.  When urinating, sit to void, flush the toilet x2, close toilet lid prior to flushing.  Increase oral intake today.  Goal would be 64 ozs per day.

## 2015-01-08 NOTE — Progress Notes (Signed)
1045-34F foley catheter inserted without difficulty by Jalene Mullet NT with observation by Norville Haggard RN.  Pt tolerated with no complaints.  100 ml. Clear, yellow urine returned.  1113-Mitomycin administered over 5 minutes via foley catheter to bladder.  Pt tolerated with no complaints or difficulties.  Pt instructed to change position every 15 minutes from abdomen, to back, to left side and then to right side for 2 hours. Teach back done. Pt instructed to notify staff if any pain or discomfort with catheter.    1325-Foley open to drainage.  300 ml clear, blue tinged urine returned.  1400-Foley catheter d/c'd without difficulty.  Pt tolerated well with no complaints.

## 2015-01-15 ENCOUNTER — Ambulatory Visit (HOSPITAL_BASED_OUTPATIENT_CLINIC_OR_DEPARTMENT_OTHER): Payer: Medicare Other

## 2015-01-15 DIAGNOSIS — Z5111 Encounter for antineoplastic chemotherapy: Secondary | ICD-10-CM | POA: Diagnosis not present

## 2015-01-15 DIAGNOSIS — C679 Malignant neoplasm of bladder, unspecified: Secondary | ICD-10-CM | POA: Diagnosis not present

## 2015-01-15 MED ORDER — ONDANSETRON HCL 8 MG PO TABS
ORAL_TABLET | ORAL | Status: AC
  Filled 2015-01-15: qty 1

## 2015-01-15 MED ORDER — ONDANSETRON HCL 8 MG PO TABS
8.0000 mg | ORAL_TABLET | Freq: Once | ORAL | Status: AC
  Administered 2015-01-15: 8 mg via ORAL

## 2015-01-15 MED ORDER — LIDOCAINE HCL 2 % EX GEL
Freq: Once | CUTANEOUS | Status: AC
Start: 2015-01-15 — End: 2015-01-15
  Administered 2015-01-15: 10 via URETHRAL
  Filled 2015-01-15: qty 20

## 2015-01-15 MED ORDER — MITOMYCIN CHEMO FOR BLADDER INSTILLATION 40 MG
40.0000 mg | Freq: Once | INTRAVENOUS | Status: AC
  Administered 2015-01-15: 40 mg via INTRAVESICAL
  Filled 2015-01-15: qty 40

## 2015-01-15 NOTE — Progress Notes (Signed)
1040-30F foley catheter inserted by Mercie Eon NT with observation of Thane Edu RN.  Foley inserted with no difficulties.  100 ml yellow clear, urine returned.  1102-Mitomycin given as ordered via foley catheter into bladder over 5 minutes.  Pt tolerated with no complaints.  Pt instructed to change positions every 15 minutes, from abdomen, to back to right side to left side.  Teach back done.  Pt instructed to notify staff if he is having any discomfort.  1310-Foley bag attached to catheter.  800 ml blue tinged urine return.  Foley catheter d/c'd at 1330 with no difficulties.

## 2015-01-15 NOTE — Patient Instructions (Addendum)
[facility] Discharge Instructions for Patients Receiving Chemotherapy  Today you received the following chemotherapy agents:  Mitomycin  To help prevent nausea and vomiting after your treatment, we encourage you to take your nausea medication as ordered per MD.   If you develop nausea and vomiting that is not controlled by your nausea medication, call the clinic.   BELOW ARE SYMPTOMS THAT SHOULD BE REPORTED IMMEDIATELY:  *FEVER GREATER THAN 100.5 F  *CHILLS WITH OR WITHOUT FEVER  NAUSEA AND VOMITING THAT IS NOT CONTROLLED WITH YOUR NAUSEA MEDICATION  *UNUSUAL SHORTNESS OF BREATH  *UNUSUAL BRUISING OR BLEEDING  TENDERNESS IN MOUTH AND THROAT WITH OR WITHOUT PRESENCE OF ULCERS  *URINARY PROBLEMS  *BOWEL PROBLEMS  UNUSUAL RASH Items with * indicate a potential emergency and should be followed up as soon as possible.  Feel free to call the clinic you have any questions or concerns. The clinic phone number is [phone].  Please show the [place] at check-in to the Emergency Department and triage nurse.   When urinating at home, sit to urinate.  Close toilet lid when flushing and flush twice.  Increase oral fluid intake to 64 ozs. Day.

## 2015-01-22 ENCOUNTER — Ambulatory Visit (HOSPITAL_BASED_OUTPATIENT_CLINIC_OR_DEPARTMENT_OTHER): Payer: Medicare Other

## 2015-01-22 DIAGNOSIS — C679 Malignant neoplasm of bladder, unspecified: Secondary | ICD-10-CM

## 2015-01-22 DIAGNOSIS — Z5111 Encounter for antineoplastic chemotherapy: Secondary | ICD-10-CM | POA: Diagnosis present

## 2015-01-22 MED ORDER — ONDANSETRON HCL 8 MG PO TABS
8.0000 mg | ORAL_TABLET | Freq: Once | ORAL | Status: AC
  Administered 2015-01-22: 8 mg via ORAL

## 2015-01-22 MED ORDER — MITOMYCIN CHEMO FOR BLADDER INSTILLATION 40 MG
40.0000 mg | Freq: Once | INTRAVENOUS | Status: AC
  Administered 2015-01-22: 40 mg via INTRAVESICAL
  Filled 2015-01-22: qty 40

## 2015-01-22 MED ORDER — LIDOCAINE HCL 2 % EX GEL
Freq: Once | CUTANEOUS | Status: AC
  Administered 2015-01-22: 10 via URETHRAL
  Filled 2015-01-22: qty 20

## 2015-01-22 MED ORDER — ONDANSETRON HCL 8 MG PO TABS
ORAL_TABLET | ORAL | Status: AC
  Filled 2015-01-22: qty 1

## 2015-01-22 NOTE — Progress Notes (Signed)
1055-14 F foley catheter inserted without difficulty per R. Greene NT.  300 ml yellow and clear urine returned.    1105-Mitomycin injected via foley catheter via bladder over 5 minutes without difficulty.  Pt instructed to change position from abdomen, to right side, to back, to left side every 15 minutes for 2 hours.  Teach back done.  Pt has no questions at this time.   1315-Foley bag attached to catheter, 300 ml blue tinged urine returned.    1330-Foley d/c'd without difficulty.  Pt tolerated with no complaints.  Discharge instructions reviewed with pt.  Teach back done.

## 2015-01-22 NOTE — Patient Instructions (Addendum)
[facility] Discharge Instructions for Patients Receiving Chemotherapy  Today you received the following chemotherapy agents:  Mitomycin  To help prevent nausea and vomiting after your treatment, we encourage you to take your nausea medication as ordered per MD.   If you develop nausea and vomiting that is not controlled by your nausea medication, call the clinic.   BELOW ARE SYMPTOMS THAT SHOULD BE REPORTED IMMEDIATELY:  *FEVER GREATER THAN 100.5 F  *CHILLS WITH OR WITHOUT FEVER  NAUSEA AND VOMITING THAT IS NOT CONTROLLED WITH YOUR NAUSEA MEDICATION  *UNUSUAL SHORTNESS OF BREATH  *UNUSUAL BRUISING OR BLEEDING  TENDERNESS IN MOUTH AND THROAT WITH OR WITHOUT PRESENCE OF ULCERS  *URINARY PROBLEMS  *BOWEL PROBLEMS  UNUSUAL RASH Items with * indicate a potential emergency and should be followed up as soon as possible.  Feel free to call the clinic you have any questions or concerns. The clinic phone number is [phone].  Please show the [facility] at check-in to the Emergency Department and triage nurse.   Sit to void when urinating at home for the next 48 hours.  Flush the toilet twice.  Increase oral intake to 64 ozs. Day.

## 2015-01-29 ENCOUNTER — Ambulatory Visit (HOSPITAL_BASED_OUTPATIENT_CLINIC_OR_DEPARTMENT_OTHER): Payer: Medicare Other | Admitting: Oncology

## 2015-01-29 VITALS — BP 147/81 | HR 86 | Temp 97.5°F | Resp 18 | Ht 71.0 in | Wt 200.7 lb

## 2015-01-29 DIAGNOSIS — C679 Malignant neoplasm of bladder, unspecified: Secondary | ICD-10-CM

## 2015-01-29 NOTE — Progress Notes (Signed)
Hematology and Oncology Follow Up Visit  Jeffrey Horn [id number] 24-Aug-1946 69 y.o. 01/29/2015 10:13 AM Jeffrey Horn, MDLittle, [name], MD   Principle Diagnosis: 69 year old gentleman with recurrent superficial bladder tumor diagnosed in December 2014.   Prior Therapy:  He is status post TURBT in February 2015 and followed by BCG treatments. He is status post mitomycin-C intravesically completed in November 2015.  He is status post repeat mitomycin-C intravesically completed in April 2016.  Current therapy: Observation and surveillance.  Interim History:  Jeffrey Horn presents today for a follow-up visit. Since the last visit, he completed intravesicular therapy of mitomycin-C without any complications. He denied any dysuria or hematuria. He did not have any complications related to Foley catheter insertion. He is completely asymptomatic at this point. He continues to have excellent performance status and functionality.  He does not report any headaches or blurry vision or double vision or syncope. He did not report any chest pain or palpitation. He does not report any orthopnea, PND or leg edema. He does not report any cough, shortness of breath or hemoptysis. He does not report any nausea or vomiting. He does not report any change in his bowel habits. He does not report any frequency urgency or hesitancy. Has not reported any skeletal complaints. Rest of his review of systems unremarkable.   Medications: I have reviewed the patient's current medications.  Current Outpatient Prescriptions  Medication Sig Dispense Refill  . finasteride (PROSCAR) 5 MG tablet Take 5 mg by mouth every evening.    . lovastatin (MEVACOR) 20 MG tablet Take 20 mg by mouth every evening.    . Multiple Vitamin (MULTIVITAMIN) tablet Take 1 tablet by mouth daily.    Marland Kitchen trimethoprim (TRIMPEX) 100 MG tablet Take 1 tablet (100 mg total) by mouth 1 day or 1 dose. 30 tablet 1   No current facility-administered  medications for this visit.     Allergies:  Allergies  Allergen Reactions  . Hydrocodone Other (See Comments)    HALLUCINATIONS  . Lipitor [Atorvastatin] Other (See Comments)    "muscle soreness"  . Pravastatin Sodium Other (See Comments)    INCREASED CK  . Sulfa Antibiotics Rash    Past Medical History, Surgical history, Social history, and Family History were reviewed and updated.   Physical Exam: Blood pressure 147/81, pulse 86, temperature 97.5 F (36.4 C), temperature source Oral, resp. rate 18, height 5\' 11"  (1.803 m), weight 200 lb 11.2 oz (91.037 kg), SpO2 96 %. ECOG: 0 General appearance: alert and cooperative Head: Normocephalic, without obvious abnormality Neck: no adenopathy Lymph nodes: Cervical, supraclavicular, and axillary nodes normal. Heart:regular rate and rhythm, S1, S2 normal, no murmur, click, rub or gallop Lung:chest clear, no wheezing, rales, normal symmetric air entry Abdomin: soft, non-tender, without masses or organomegaly EXT:no erythema, induration, or nodules   Lab Results: Lab Results  Component Value Date   WBC 14.4* 02/24/2009   HGB 14.8 11/01/2014   HCT 35.3* 02/24/2009   MCV 93.0 02/24/2009   PLT 162 02/24/2009     Chemistry      Component Value Date/Time   NA 133* 02/23/2009 1734   K 3.7 02/23/2009 1734   CL 103 02/23/2009 1734   CO2 27 02/23/2009 1734   BUN 16 02/23/2009 1734   CREATININE 0.96 02/23/2009 1734      Component Value Date/Time   CALCIUM 9.9 02/23/2009 1734   ALKPHOS 56 02/23/2009 1734   AST 24 02/23/2009 1734   ALT 21 02/23/2009 1734  BILITOT 0.9 02/23/2009 1734          Impression and Plan:  69 year old gentleman with recurrent superficial bladder tumor initially diagnosed in December of 2014. He is status post TURBT in February of 2015 and subsequently BCG full dose with 6 treatments a week apart. He had recurrence with 5 superficial bladder tumors in September 2015.  He is status post 6  treatments of mitomycin-C intravesically completed in November 2015 without any complications. He is status post repeat cystoscopy in January 2016 which showed recurrence of his tumor but certainly improvement in the number and the size of the previous tumors.   He completed a repeat mitomycin-C intravesical treatment which was completed on 01/22/2015 after receiving 6 weekly treatments of 40 mg of mitomycin. He tolerated these treatments well without any complications.  He is scheduled to have a repeat cystoscopy in the near future and to the care of Dr. Gaynelle Arabian. He had questions regarding other therapies that is available if his tumor relapses. I I discussed with him the option of a radical cystectomy certainly if he develop more dangerous or muscle invasive tumors. I also discussed with him the possibility of using Valrubicin as a salvage intravesicular treatment. This medication has been recently approved for recurrent Tis lesions after failed BCG treatments.  All his questions were answered today and follow-up will be as needed.     [facility], MD 4/13/201610:13 AM

## 2015-02-04 DIAGNOSIS — M545 Low back pain: Secondary | ICD-10-CM | POA: Diagnosis not present

## 2015-02-11 DIAGNOSIS — C671 Malignant neoplasm of dome of bladder: Secondary | ICD-10-CM | POA: Diagnosis not present

## 2015-04-14 ENCOUNTER — Other Ambulatory Visit: Payer: Self-pay

## 2015-05-07 DIAGNOSIS — M25512 Pain in left shoulder: Secondary | ICD-10-CM | POA: Diagnosis not present

## 2015-05-19 DIAGNOSIS — Z8551 Personal history of malignant neoplasm of bladder: Secondary | ICD-10-CM | POA: Diagnosis not present

## 2015-07-02 DIAGNOSIS — Z23 Encounter for immunization: Secondary | ICD-10-CM | POA: Diagnosis not present

## 2015-07-16 DIAGNOSIS — E291 Testicular hypofunction: Secondary | ICD-10-CM | POA: Diagnosis not present

## 2015-07-16 DIAGNOSIS — C673 Malignant neoplasm of anterior wall of bladder: Secondary | ICD-10-CM | POA: Diagnosis not present

## 2015-07-16 DIAGNOSIS — N62 Hypertrophy of breast: Secondary | ICD-10-CM | POA: Diagnosis not present

## 2015-07-23 ENCOUNTER — Other Ambulatory Visit: Payer: Self-pay | Admitting: Dermatology

## 2015-07-23 DIAGNOSIS — D485 Neoplasm of uncertain behavior of skin: Secondary | ICD-10-CM | POA: Diagnosis not present

## 2015-07-23 DIAGNOSIS — L905 Scar conditions and fibrosis of skin: Secondary | ICD-10-CM | POA: Diagnosis not present

## 2015-08-05 ENCOUNTER — Other Ambulatory Visit: Payer: Self-pay | Admitting: Family Medicine

## 2015-08-05 DIAGNOSIS — N62 Hypertrophy of breast: Secondary | ICD-10-CM

## 2015-08-05 DIAGNOSIS — N644 Mastodynia: Secondary | ICD-10-CM

## 2015-08-15 ENCOUNTER — Ambulatory Visit
Admission: RE | Admit: 2015-08-15 | Discharge: 2015-08-15 | Disposition: A | Discharge status: Home / Self Care | Payer: Medicare Other | Source: Ambulatory Visit | Attending: Family Medicine | Admitting: Family Medicine

## 2015-08-15 DIAGNOSIS — N644 Mastodynia: Secondary | ICD-10-CM | POA: Diagnosis not present

## 2015-08-15 DIAGNOSIS — N62 Hypertrophy of breast: Secondary | ICD-10-CM

## 2015-09-23 DIAGNOSIS — R350 Frequency of micturition: Secondary | ICD-10-CM | POA: Diagnosis not present

## 2015-09-23 DIAGNOSIS — C672 Malignant neoplasm of lateral wall of bladder: Secondary | ICD-10-CM | POA: Diagnosis not present

## 2015-09-24 ENCOUNTER — Other Ambulatory Visit: Payer: Self-pay | Admitting: Urology

## 2015-09-24 DIAGNOSIS — D1801 Hemangioma of skin and subcutaneous tissue: Secondary | ICD-10-CM | POA: Diagnosis not present

## 2015-09-24 DIAGNOSIS — L821 Other seborrheic keratosis: Secondary | ICD-10-CM | POA: Diagnosis not present

## 2015-09-24 DIAGNOSIS — L812 Freckles: Secondary | ICD-10-CM | POA: Diagnosis not present

## 2015-09-24 DIAGNOSIS — L570 Actinic keratosis: Secondary | ICD-10-CM | POA: Diagnosis not present

## 2015-09-24 DIAGNOSIS — L905 Scar conditions and fibrosis of skin: Secondary | ICD-10-CM | POA: Diagnosis not present

## 2015-10-02 ENCOUNTER — Other Ambulatory Visit (HOSPITAL_COMMUNITY): Payer: Self-pay | Admitting: Urology

## 2015-10-02 DIAGNOSIS — R9389 Abnormal findings on diagnostic imaging of other specified body structures: Secondary | ICD-10-CM

## 2015-10-03 DIAGNOSIS — H2513 Age-related nuclear cataract, bilateral: Secondary | ICD-10-CM | POA: Diagnosis not present

## 2015-10-19 HISTORY — PX: TONSILLECTOMY: SUR1361

## 2015-10-23 ENCOUNTER — Ambulatory Visit (HOSPITAL_COMMUNITY)
Admission: RE | Admit: 2015-10-23 | Discharge: 2015-10-23 | Disposition: A | Discharge status: Home / Self Care | Payer: Medicare Other | Source: Ambulatory Visit | Attending: Urology | Admitting: Urology

## 2015-10-23 DIAGNOSIS — R9389 Abnormal findings on diagnostic imaging of other specified body structures: Secondary | ICD-10-CM

## 2015-10-23 DIAGNOSIS — N419 Inflammatory disease of prostate, unspecified: Secondary | ICD-10-CM | POA: Diagnosis not present

## 2015-10-23 DIAGNOSIS — R938 Abnormal findings on diagnostic imaging of other specified body structures: Secondary | ICD-10-CM | POA: Diagnosis not present

## 2015-10-23 DIAGNOSIS — Z0000 Encounter for general adult medical examination without abnormal findings: Secondary | ICD-10-CM | POA: Diagnosis not present

## 2015-10-23 LAB — POCT I-STAT CREATININE: Creatinine, Ser: 0.9 mg/dL (ref 0.61–1.24)

## 2015-10-23 MED ORDER — GADOBENATE DIMEGLUMINE 529 MG/ML IV SOLN
20.0000 mL | Freq: Once | INTRAVENOUS | Status: AC | PRN
  Administered 2015-10-23: 19 mL via INTRAVENOUS

## 2015-10-24 ENCOUNTER — Encounter (HOSPITAL_BASED_OUTPATIENT_CLINIC_OR_DEPARTMENT_OTHER): Payer: Self-pay | Admitting: *Deleted

## 2015-10-24 NOTE — Progress Notes (Signed)
NPO AFTER MN.  ARRIVE AT 0930.  NEEDS HG. 

## 2015-10-28 NOTE — Anesthesia Preprocedure Evaluation (Addendum)
Anesthesia Evaluation  Patient identified by MRN, date of birth, ID band Patient awake    Reviewed: Allergy & Precautions, NPO status , Patient's Chart, lab work & pertinent test results  Airway Mallampati: II  TM Distance: >3 FB Neck ROM: Full    Dental no notable dental hx. (+) Dental Advisory Given, Teeth Intact   Pulmonary neg pulmonary ROS,    Pulmonary exam normal breath sounds clear to auscultation       Cardiovascular negative cardio ROS Normal cardiovascular exam Rhythm:Regular Rate:Normal     Neuro/Psych negative neurological ROS  negative psych ROS   GI/Hepatic negative GI ROS, Neg liver ROS,   Endo/Other  negative endocrine ROS  Renal/GU Renal disease  negative genitourinary   Musculoskeletal negative musculoskeletal ROS (+)   Abdominal   Peds negative pediatric ROS (+)  Hematology negative hematology ROS (+)   Anesthesia Other Findings   Reproductive/Obstetrics negative OB ROS                            Anesthesia Physical  Anesthesia Plan  ASA: II  Anesthesia Plan: General   Post-op Pain Management:    Induction: Intravenous  Airway Management Planned: LMA  Additional Equipment:   Intra-op Plan:   Post-operative Plan: Extubation in OR  Informed Consent: I have reviewed the patients History and Physical, chart, labs and discussed the procedure including the risks, benefits and alternatives for the proposed anesthesia with the patient or authorized representative who has indicated his/her understanding and acceptance.   Dental advisory given  Plan Discussed with: CRNA  Anesthesia Plan Comments:         Anesthesia Quick Evaluation

## 2015-10-30 ENCOUNTER — Encounter (HOSPITAL_BASED_OUTPATIENT_CLINIC_OR_DEPARTMENT_OTHER)
Admission: RE | Disposition: A | Discharge status: Home / Self Care | Payer: Self-pay | Source: Ambulatory Visit | Attending: Urology

## 2015-10-30 ENCOUNTER — Ambulatory Visit (HOSPITAL_BASED_OUTPATIENT_CLINIC_OR_DEPARTMENT_OTHER)
Admission: RE | Admit: 2015-10-30 | Discharge: 2015-10-30 | Disposition: A | Discharge status: Home / Self Care | Payer: Medicare Other | Source: Ambulatory Visit | Attending: Urology | Admitting: Urology

## 2015-10-30 ENCOUNTER — Ambulatory Visit (HOSPITAL_COMMUNITY): Payer: Medicare Other

## 2015-10-30 ENCOUNTER — Encounter (HOSPITAL_BASED_OUTPATIENT_CLINIC_OR_DEPARTMENT_OTHER): Payer: Self-pay | Admitting: Anesthesiology

## 2015-10-30 ENCOUNTER — Ambulatory Visit (HOSPITAL_BASED_OUTPATIENT_CLINIC_OR_DEPARTMENT_OTHER): Payer: Medicare Other | Admitting: Anesthesiology

## 2015-10-30 DIAGNOSIS — Z85828 Personal history of other malignant neoplasm of skin: Secondary | ICD-10-CM | POA: Diagnosis not present

## 2015-10-30 DIAGNOSIS — N400 Enlarged prostate without lower urinary tract symptoms: Secondary | ICD-10-CM | POA: Insufficient documentation

## 2015-10-30 DIAGNOSIS — N3289 Other specified disorders of bladder: Secondary | ICD-10-CM | POA: Diagnosis not present

## 2015-10-30 DIAGNOSIS — C672 Malignant neoplasm of lateral wall of bladder: Secondary | ICD-10-CM

## 2015-10-30 DIAGNOSIS — M797 Fibromyalgia: Secondary | ICD-10-CM | POA: Insufficient documentation

## 2015-10-30 DIAGNOSIS — Z9049 Acquired absence of other specified parts of digestive tract: Secondary | ICD-10-CM | POA: Insufficient documentation

## 2015-10-30 DIAGNOSIS — C678 Malignant neoplasm of overlapping sites of bladder: Secondary | ICD-10-CM | POA: Insufficient documentation

## 2015-10-30 DIAGNOSIS — Z87442 Personal history of urinary calculi: Secondary | ICD-10-CM | POA: Diagnosis not present

## 2015-10-30 DIAGNOSIS — D494 Neoplasm of unspecified behavior of bladder: Secondary | ICD-10-CM

## 2015-10-30 DIAGNOSIS — E785 Hyperlipidemia, unspecified: Secondary | ICD-10-CM | POA: Insufficient documentation

## 2015-10-30 DIAGNOSIS — C679 Malignant neoplasm of bladder, unspecified: Secondary | ICD-10-CM | POA: Diagnosis not present

## 2015-10-30 DIAGNOSIS — C674 Malignant neoplasm of posterior wall of bladder: Secondary | ICD-10-CM | POA: Diagnosis not present

## 2015-10-30 HISTORY — PX: CYSTOSCOPY W/ RETROGRADES: SHX1426

## 2015-10-30 HISTORY — PX: TRANSURETHRAL RESECTION OF BLADDER TUMOR: SHX2575

## 2015-10-30 LAB — POCT HEMOGLOBIN-HEMACUE: HEMOGLOBIN: 15.3 g/dL (ref 13.0–17.0)

## 2015-10-30 SURGERY — TURBT (TRANSURETHRAL RESECTION OF BLADDER TUMOR)
Anesthesia: General

## 2015-10-30 MED ORDER — PROPOFOL 10 MG/ML IV BOLUS
INTRAVENOUS | Status: AC
  Filled 2015-10-30: qty 20

## 2015-10-30 MED ORDER — CEFAZOLIN SODIUM-DEXTROSE 2-3 GM-% IV SOLR
INTRAVENOUS | Status: DC | PRN
  Administered 2015-10-30: 2 g via INTRAVENOUS

## 2015-10-30 MED ORDER — IOHEXOL 350 MG/ML SOLN
INTRAVENOUS | Status: DC | PRN
  Administered 2015-10-30: 20 mL via URETHRAL

## 2015-10-30 MED ORDER — EPHEDRINE SULFATE 50 MG/ML IJ SOLN
INTRAMUSCULAR | Status: DC | PRN
  Administered 2015-10-30: 10 mg via INTRAVENOUS
  Administered 2015-10-30: 15 mg via INTRAVENOUS

## 2015-10-30 MED ORDER — ACETAMINOPHEN 10 MG/ML IV SOLN
INTRAVENOUS | Status: AC
  Filled 2015-10-30: qty 100

## 2015-10-30 MED ORDER — STERILE WATER FOR IRRIGATION IR SOLN
Status: DC | PRN
  Administered 2015-10-30 (×2): 3000 mL via INTRAVESICAL

## 2015-10-30 MED ORDER — TRIMETHOPRIM 100 MG PO TABS: 100.0000 mg | ORAL_TABLET | ORAL | Status: DC

## 2015-10-30 MED ORDER — CEFAZOLIN SODIUM-DEXTROSE 2-3 GM-% IV SOLR
INTRAVENOUS | Status: AC
  Filled 2015-10-30: qty 50

## 2015-10-30 MED ORDER — FENTANYL CITRATE (PF) 100 MCG/2ML IJ SOLN
25.0000 ug | INTRAMUSCULAR | Status: DC | PRN
  Filled 2015-10-30: qty 1

## 2015-10-30 MED ORDER — PROMETHAZINE HCL 25 MG/ML IJ SOLN
6.2500 mg | INTRAMUSCULAR | Status: DC | PRN
  Filled 2015-10-30: qty 1

## 2015-10-30 MED ORDER — ACETAMINOPHEN 10 MG/ML IV SOLN
INTRAVENOUS | Status: DC | PRN
  Administered 2015-10-30: 1000 mg via INTRAVENOUS

## 2015-10-30 MED ORDER — URELLE 81 MG PO TABS
1.0000 | ORAL_TABLET | Freq: Four times a day (QID) | ORAL | Status: DC
  Administered 2015-10-30: 81 mg via ORAL
  Filled 2015-10-30: qty 1

## 2015-10-30 MED ORDER — MIDAZOLAM HCL 2 MG/2ML IJ SOLN
INTRAMUSCULAR | Status: AC
  Filled 2015-10-30: qty 2

## 2015-10-30 MED ORDER — URELLE 81 MG PO TABS
ORAL_TABLET | ORAL | Status: AC
  Filled 2015-10-30: qty 1

## 2015-10-30 MED ORDER — FENTANYL CITRATE (PF) 250 MCG/5ML IJ SOLN
INTRAMUSCULAR | Status: AC
  Filled 2015-10-30: qty 5

## 2015-10-30 MED ORDER — FENTANYL CITRATE (PF) 100 MCG/2ML IJ SOLN
INTRAMUSCULAR | Status: AC
  Filled 2015-10-30: qty 2

## 2015-10-30 MED ORDER — UROGESIC-BLUE 81.6 MG PO TABS: ORAL_TABLET | ORAL | Status: DC

## 2015-10-30 MED ORDER — ONDANSETRON HCL 4 MG/2ML IJ SOLN
INTRAMUSCULAR | Status: AC
  Filled 2015-10-30: qty 2

## 2015-10-30 MED ORDER — MITOMYCIN CHEMO FOR BLADDER INSTILLATION 40 MG
40.0000 mg | Freq: Once | INTRAVENOUS | Status: AC
  Administered 2015-10-30: 40 mg via INTRAVESICAL
  Filled 2015-10-30: qty 40

## 2015-10-30 MED ORDER — FENTANYL CITRATE (PF) 100 MCG/2ML IJ SOLN
INTRAMUSCULAR | Status: DC | PRN
  Administered 2015-10-30: 50 ug via INTRAVENOUS
  Administered 2015-10-30 (×2): 25 ug via INTRAVENOUS

## 2015-10-30 MED ORDER — LACTATED RINGERS IV SOLN
INTRAVENOUS | Status: DC
  Administered 2015-10-30: 11:00:00 via INTRAVENOUS
  Filled 2015-10-30: qty 1000

## 2015-10-30 MED ORDER — KETOROLAC TROMETHAMINE 30 MG/ML IJ SOLN
INTRAMUSCULAR | Status: DC | PRN
  Administered 2015-10-30: 30 mg via INTRAVENOUS

## 2015-10-30 MED ORDER — DEXAMETHASONE SODIUM PHOSPHATE 10 MG/ML IJ SOLN
INTRAMUSCULAR | Status: AC
  Filled 2015-10-30: qty 1

## 2015-10-30 MED ORDER — LIDOCAINE HCL (CARDIAC) 20 MG/ML IV SOLN
INTRAVENOUS | Status: DC | PRN
  Administered 2015-10-30: 60 mg via INTRAVENOUS

## 2015-10-30 MED ORDER — MEPERIDINE HCL 25 MG/ML IJ SOLN
6.2500 mg | INTRAMUSCULAR | Status: DC | PRN
  Filled 2015-10-30: qty 1

## 2015-10-30 MED ORDER — PROPOFOL 10 MG/ML IV BOLUS
INTRAVENOUS | Status: DC | PRN
  Administered 2015-10-30: 200 mg via INTRAVENOUS

## 2015-10-30 MED ORDER — ONDANSETRON HCL 4 MG/2ML IJ SOLN
INTRAMUSCULAR | Status: DC | PRN
  Administered 2015-10-30: 4 mg via INTRAVENOUS

## 2015-10-30 MED ORDER — DEXAMETHASONE SODIUM PHOSPHATE 4 MG/ML IJ SOLN
INTRAMUSCULAR | Status: DC | PRN
  Administered 2015-10-30: 10 mg via INTRAVENOUS

## 2015-10-30 MED ORDER — MIDAZOLAM HCL 5 MG/5ML IJ SOLN
INTRAMUSCULAR | Status: DC | PRN
  Administered 2015-10-30: 2 mg via INTRAVENOUS

## 2015-10-30 SURGICAL SUPPLY — 39 items
ADAPTER CATH URET PLST 4-6FR (CATHETERS) ×1 IMPLANT
ADPR CATH URET STRL DISP 4-6FR (CATHETERS) ×2
BAG DRAIN URO-CYSTO SKYTR STRL (DRAIN) ×3 IMPLANT
BAG DRN ANRFLXCHMBR STRAP LEK (BAG)
BAG DRN UROCATH (DRAIN) ×2
BAG URINE DRAINAGE (UROLOGICAL SUPPLIES) ×1 IMPLANT
BAG URINE LEG 19OZ MD ST LTX (BAG) IMPLANT
BOOTIES KNEE HIGH SLOAN (MISCELLANEOUS) ×3 IMPLANT
CANISTER SUCT LVC 12 LTR MEDI- (MISCELLANEOUS) IMPLANT
CATH FOLEY 2WAY SLVR  5CC 18FR (CATHETERS) ×1
CATH FOLEY 2WAY SLVR  5CC 20FR (CATHETERS)
CATH FOLEY 2WAY SLVR  5CC 22FR (CATHETERS)
CATH FOLEY 2WAY SLVR 5CC 18FR (CATHETERS) IMPLANT
CATH FOLEY 2WAY SLVR 5CC 20FR (CATHETERS) IMPLANT
CATH FOLEY 2WAY SLVR 5CC 22FR (CATHETERS) IMPLANT
CATH INTERMIT  6FR 70CM (CATHETERS) ×1 IMPLANT
CATH URET DUAL LUMEN 6-10FR 50 (CATHETERS) IMPLANT
CLOTH BEACON ORANGE TIMEOUT ST (SAFETY) ×3 IMPLANT
ELECT REM PT RETURN 9FT ADLT (ELECTROSURGICAL) ×3
ELECTRODE REM PT RTRN 9FT ADLT (ELECTROSURGICAL) ×2 IMPLANT
GLOVE BIO SURGEON STRL SZ7.5 (GLOVE) ×3 IMPLANT
GOWN STRL REUS W/ TWL LRG LVL3 (GOWN DISPOSABLE) ×2 IMPLANT
GOWN STRL REUS W/ TWL XL LVL3 (GOWN DISPOSABLE) ×2 IMPLANT
GOWN STRL REUS W/TWL LRG LVL3 (GOWN DISPOSABLE) ×3
GOWN STRL REUS W/TWL XL LVL3 (GOWN DISPOSABLE) ×3
GOWN XL W/COTTON TOWEL STD (GOWNS) ×3 IMPLANT
GUIDEWIRE 0.038 PTFE COATED (WIRE) IMPLANT
GUIDEWIRE ANG ZIPWIRE 038X150 (WIRE) IMPLANT
GUIDEWIRE STR DUAL SENSOR (WIRE) ×1 IMPLANT
HOLDER FOLEY CATH W/STRAP (MISCELLANEOUS) ×1 IMPLANT
KIT ROOM TURNOVER WOR (KITS) ×3 IMPLANT
MANIFOLD NEPTUNE II (INSTRUMENTS) ×1 IMPLANT
NS IRRIG 500ML POUR BTL (IV SOLUTION) IMPLANT
PACK CYSTO (CUSTOM PROCEDURE TRAY) ×3 IMPLANT
PLUG CATH AND CAP STER (CATHETERS) IMPLANT
SET ASPIRATION TUBING (TUBING) IMPLANT
SYR CONTROL 10ML LL (SYRINGE) ×1 IMPLANT
SYRINGE IRR TOOMEY STRL 70CC (SYRINGE) IMPLANT
TUBE CONNECTING 12X1/4 (SUCTIONS) ×1 IMPLANT

## 2015-10-30 NOTE — Discharge Instructions (Addendum)
Bladder Cancer Bladder cancer is an abnormal growth of tissue in your bladder. Your bladder is the balloon-like sac in your pelvis. It collects and stores urine that comes from the kidneys through the ureters. The bladder wall is made of layers. If cancer spreads into these layers and through the wall of the bladder, it becomes more difficult to treat.  There are four stages of bladder cancer:  Stage I. Cancer at this stage occurs in the bladder's inner lining but has not invaded the muscular bladder wall.  Stage II. At this stage, cancer has invaded the bladder wall but is still confined to the bladder.  Stage III. By this stage, the cancer cells have spread through the bladder wall to surrounding tissue. They may also have spread to the prostate in men or the uterus or vagina in women.  Stage IV. By this stage, cancer cells may have spread to the lymph nodes and other organs, such as your lungs, bones, or liver. RISK FACTORS Although the cause of bladder cancer is not known, the following risk factors can increase your chances of getting bladder cancer:   Smoking.   Occupational exposures, such as rubber, leather, textile, dyes, chemicals, and paint.  Being white.  Age.   Being male.   Having chronic bladder inflammation.   Having a bladder cancer history.   Having a family history of bladder cancer (heredity).   Having had chemotherapy or radiation therapy to the pelvis.   Being exposed to arsenic.  SYMPTOMS   Blood in the urine.   Pain with urination.   Frequent bladder or urine infections.  Increase in urgency and frequency of urination. DIAGNOSIS  Your health care provider may suspect bladder cancer based on your description of urinary symptoms or based on the finding of blood or infection in the urine (especially if this has recurred several times). Other tests or procedures that may be performed include:   A narrow tube being inserted into your bladder  through your urethra (cystoscopy) in order to view the lining of your bladder for tumors.   A biopsy to sample the tumor to see if cancer is present.  If cancer is present, it will then be staged to determine its severity and extent. It is important to know how deeply into the bladder wall the cancer has grown and whether the cancer has spread to any other parts of your body. Staging may require blood tests or special scans such as a CT scan, MRI, bone scan, or chest X-ray.  TREATMENT  Once your cancer has been diagnosed and staged, you should discuss a treatment plan with your health care provider. Based on the stage of the cancer, one treatment or a combination of treatments may be recommended. The most common forms of treatment are:   Surgery. Procedures that may be done include transurethral resection and cystectomy.  Radiation therapy. This is infrequently used to treat bladder cancer.   Chemotherapy. During this treatment, drugs are used to kill cancer cells.  Immunotherapy. This is usually administered directly into the bladder. HOME CARE INSTRUCTIONS  Take medicines only as directed by your health care provider.   Maintain a healthy diet.   Consider joining a support group. This may help you learn to cope with the stress of having bladder cancer.   Seek advice to help you manage treatment side effects.   Keep all follow-up visits as directed by your health care provider.   Inform your cancer specialist if you are  admitted to the hospital.  [place] IF:  There is blood in your urine.  You have symptoms of a urinary tract infection. These include:  Tiredness.  Shakiness.  Weakness.  Muscle aches.  Abdominal pain.  Frequent and intense urge to urinate (in young women).  Burning feeling in the bladder or urethra during urination (in young women). SEEK IMMEDIATE MEDICAL CARE IF:  You are unable to urinate.   This information is not intended to  replace advice given to you by your health care provider. Make sure you discuss any questions you have with your health care provider.   Document Released: 10/07/2003 Document Revised: 10/25/2014 Document Reviewed: 03/27/2013 Elsevier Interactive Patient Education 2016 [employer] Transurethral Resection, Bladder Tumor A cancerous growth (tumor) can develop on the inside wall of the bladder. The bladder is the organ that holds urine. One way to remove the tumor is a procedure called a transurethral resection. The tumor is removed (resected) through the tube that carries urine from the bladder out of the body (urethra). No cuts (incisions) are made in the skin. Instead, the procedure is done through a thin telescope, called a resectoscope. Attached to it is a light and usually a tiny camera. The resectoscope is put into the urethra. In men, the urethra opens at the end of the penis. In women, it opens just above the vagina.  A transurethral resection is usually used to remove tumors that have not gotten too big or too deep. These are called Stage 0, Stage 1 or Stage 2 bladder cancers. LET YOUR CAREGIVER KNOW ABOUT:  On the day of the procedure, your caregivers will need to know the last time you had anything to eat or drink. This includes water, gum, and candy. In advance, make sure they know about:   Any allergies.  All medications you are taking, including:  Herbs, eyedrops, over-the-counter medications and creams.  Blood thinners (anticoagulants), aspirin or other drugs that could affect blood clotting.  Use of steroids (by mouth or as creams).  Previous problems with anesthetics, including local anesthetics.  Possibility of pregnancy, if this applies.  Any history of blood clots.  Any history of bleeding or other blood problems.  Previous surgery.  Smoking history.  Any recent symptoms of colds or infections.  Other health problems. RISKS AND COMPLICATIONS This is usually a  safe procedure. Every procedure has risks, though. For a transurethral resection, they include:  Infection. Antibiotic medication would need to be taken.  Bleeding.  Light bleeding may last for several days after the procedure.  If bleeding continues or is heavy, the bladder may need rinsing. Or, a new catheter might be put in for awhile.  Sometimes bed rest is needed.  Urination problems.  Pain and burning can occur when urinating. This usually goes away in a few days.  Scarring from the procedure can block the flow of urine.  Bladder damage.  It can be punctured or torn during removal of the tumor. If this happens, a catheter might be needed for longer. Antibiotics would be taken while the bladder heals.  Urine can leak through the hole or tear into the abdomen. If this happens, surgery may be needed to repair the bladder. BEFORE THE PROCEDURE   A medical evaluation will be done. This may include:  A physical examination.  Urine test. This is to make sure you do not have a urinary tract infection.  Blood tests.  A test that checks the heart's rhythm (electrocardiogram).  Talking with an anesthesiologist. This is the person who will be in charge of the medication (anesthesia) to keep you from feeling pain during the transurethral resection. You might be asleep during the procedure (general anesthesia) or numb from the waist down, but awake during the procedure (spinal anesthesia). Ask your surgeon what to expect.  The person who is having a transurethral resection needs to give what is called informed consent. This requires signing a legal paper that gives permission for the procedure. To give informed consent:  You must understand how the procedure is done and why.  You must be told all the risks and benefits of the procedure.  You must sign the consent. Sometimes a legal guardian can do this.  Signing should be witnessed by a healthcare professional.  The day before  the surgery, eat only a light dinner. Then, do not eat or drink anything for at least 8 hours before the surgery. Ask your caregiver if it is OK to take any needed medicines with a sip of water.  Arrive at least an hour before the surgery or whenever your surgeon recommends. This will give you time to check in and fill out any needed paperwork. PROCEDURE  The preparation:  You will change into a hospital gown.  A needle will be inserted in your arm. This is an intravenous access tube (IV). Medication will be able to flow directly into your body through this needle.  Small monitors will be put on your body. They are used to check your heart, blood pressure, and oxygen level.  You might be given medication that will help you relax (sedative).  You will be given a general anesthetic or spinal anesthesia.  The procedure:  Once you are asleep or numb from the waist down, your legs will be placed in stirrups.  The resectoscope will be passed through the urethra into the bladder.  Fluid will be passed through the resectoscope. This will fill the bladder with water.  The surgeon will examine the bladder through the scope. If the scope has a camera, it can take pictures from inside the bladder. They can be projected onto a TV screen.  The surgeon will use various tools to remove the tumor in small pieces. Sometimes a laser (a beam of light energy) is used. Other tools may use electric current.  A tube (catheter) will often be placed so that urine can drain into a bag outside the body. This process helps stop bleeding. This tube keeps blood clots from blocking the urethra.  The procedure usually takes 30 to 45 minutes. AFTER THE PROCEDURE   You will stay in a recovery area until the anesthesia has worn off. Your blood pressure and pulse will be checked every so often. Then you will be taken to a hospital room.  You may continue to get fluids through the IV for awhile.  Some pain is  normal. The catheter might be uncomfortable. Pain is usually not severe. If it is, ask for pain medicine.  Your urine may look bloody after a transurethral resection. This is normal.  If bleeding is heavy, a hospital caregiver may rinse out the bladder (irrigation) through the catheter.  Once the urine is clear, the catheter will be taken out.  You will need to stay in the hospital until you can urinate on your own.   PROGNOSIS   Transurethral resection is considered the best way to treat bladder tumors that are not too far along. For most people, the  treatment is successful. Sometimes, though, more treatment is needed.  Bladder cancers can come back even after a successful procedure. Because of this, be sure to have a checkup with your caregiver every 3 to 6 months. If everything is OK for 3 years, you can reduce the checkups to once a year.   This information is not intended to replace advice given to you by your health care provider. Make sure you discuss any questions you have with your health care provider.   Document Released: 07/31/2009 Document Revised: 12/27/2011  Elsevier Interactive Patient Education 2016 [facility] Instructions  Activity: Get plenty of rest for the remainder of the day. A responsible adult should stay with you for 24 hours following the procedure.  For the next 24 hours, DO NOT: -Drive a car -Paediatric nurse -Drink alcoholic beverages -Take any medication unless instructed by your physician -Make any legal decisions or sign important papers.  Meals: Start with liquid foods such as gelatin or soup. Progress to regular foods as tolerated. Avoid greasy, spicy, heavy foods. If nausea and/or vomiting occur, drink only clear liquids until the nausea and/or vomiting subsides. Call your physician if vomiting continues.  Special Instructions/Symptoms: Your throat may feel dry or sore from the anesthesia or the breathing tube  placed in your throat during surgery. If this causes discomfort, gargle with warm salt water. The discomfort should disappear within 24 hours.  If you had a scopolamine patch placed behind your ear for the management of post- operative nausea and/or vomiting:  1. The medication in the patch is effective for 72 hours, after which it should be removed.  Wrap patch in a tissue and discard in the trash. Wash hands thoroughly with soap and water. 2. You may remove the patch earlier than 72 hours if you experience unpleasant side effects which may include dry mouth, dizziness or visual disturbances. 3. Avoid touching the patch. Wash your hands with soap and water after contact with the patch.

## 2015-10-30 NOTE — Transfer of Care (Signed)
Immediate Anesthesia Transfer of Care Note  Patient: Jeffrey Horn  Procedure(s) Performed: Procedure(s): TRANSURETHRAL RESECTION OF BLADDER TUMOR X2 (TURBT) WITH TAUBER FORCEPS (N/A) CYSTOSCOPY WITH RETROGRADE PYELOGRAM (Bilateral)  Patient Location: PACU  Anesthesia Type:General  Level of Consciousness: awake, alert , oriented and patient cooperative  Airway & Oxygen Therapy: Patient Spontanous Breathing and Patient connected to nasal cannula oxygen  Post-op Assessment: Report given to RN and Post -op Vital signs reviewed and stable  Post vital signs: Reviewed and stable  Last Vitals:  Filed Vitals:   10/30/15 0946  BP: 122/86  Pulse: 74  Temp: 36.7 C  Resp: 12    Complications: No apparent anesthesia complications

## 2015-10-30 NOTE — Anesthesia Procedure Notes (Signed)
Procedure Name: LMA Insertion Date/Time: 10/30/2015 11:01 AM Performed by: Wanita Chamberlain Pre-anesthesia Checklist: Patient identified, Timeout performed, Emergency Drugs available, Suction available and Patient being monitored Patient Re-evaluated:Patient Re-evaluated prior to inductionOxygen Delivery Method: Circle system utilized Preoxygenation: Pre-oxygenation with 100% oxygen Intubation Type: IV induction Ventilation: Mask ventilation without difficulty LMA: LMA inserted LMA Size: 4.0 Number of attempts: 1 Airway Equipment and Method: Bite block Placement Confirmation: breath sounds checked- equal and bilateral and positive ETCO2 Tube secured with: Tape Dental Injury: Teeth and Oropharynx as per pre-operative assessment

## 2015-10-30 NOTE — Anesthesia Postprocedure Evaluation (Signed)
Anesthesia Post Note  Patient: KOAN HAGGSTROM  Procedure(s) Performed: Procedure(s) (LRB): TRANSURETHRAL RESECTION OF BLADDER TUMOR X2 (TURBT) WITH TAUBER FORCEPS (N/A) CYSTOSCOPY WITH RETROGRADE PYELOGRAM (Bilateral)  Patient location during evaluation: PACU Anesthesia Type: General Level of consciousness: awake and alert Pain management: pain level controlled Vital Signs Assessment: post-procedure vital signs reviewed and stable Respiratory status: spontaneous breathing, nonlabored ventilation, respiratory function stable and patient connected to nasal cannula oxygen Cardiovascular status: blood pressure returned to baseline and stable Postop Assessment: no signs of nausea or vomiting Anesthetic complications: no    Last Vitals:  Filed Vitals:   10/30/15 0946  BP: 122/86  Pulse: 74  Temp: 36.7 C  Resp: 12    Last Pain: There were no vitals filed for this visit.               Josafat Enrico

## 2015-10-30 NOTE — H&P (Signed)
Reason For Visit 4 mo cystoscopy   Active Problems Problems  1. Benign nodular prostatic hyperplasia without lower urinary tract symptoms (N40.0) 2. Bilateral kidney stones (N20.0) 3. Bladder mass (N32.89) 4. Gross hematuria (R31.0) 5. Left nephrolithiasis (N20.0) 6. Malignant neoplasm of anterior wall of urinary bladder (C67.3) 7. Malignant neoplasm of dome of urinary bladder (C67.1) 8. Malignant neoplasm of lateral wall of urinary bladder (C67.2) 9. Malignant neoplasm of trigone of urinary bladder (C67.0) 10. Nodular prostate (N40.2) 11. Prostate Hard Area Or Nodule On The Right 12. Pyuria (N39.0) 13. Renal cyst, acquired (N28.1) 14. Stage I papillary adenocarcinoma of bladder (C67.9)  History of Present Illness    70 yo married male returns today for a 4 mo cystoscopy for hx of bladder cancer. He is s/p cysto/TURBT/ cauterization of tumor recurrence on L bladder wall on 11/01/14. Hx of bladder cancer, s/p Mitomycin-C chemo intravesical instillations x 6 weeks, finished on 09/18/14. S/p cysto/TURBT on 07/12/14. He is s/p 6 treatments of BCG instillations (last one was 02/20/14). He is s/p TURBT & TURP on 12/03/13. He has current IPSS=14.     Hx of gross hematuria. Originally referred by Dr. Rex Kras for further evaluation of asymptomatic gross hematuria & passing blood clots. He states that this happened on 10/05/13 & lasted x 5 days. His urine has since been clear. IPSS= 1 only.       He had a CT on 10/05/13 that showed 6.7cm low density lesion on the Rt renal lower pole, that mostly likely is a renal cyst. It also showed that he had bilateral kidney stones. Stones in the Lt kidney are larger than the Rt.      Low dose ASA, but off since Dec 17th. No tobacco (neg history). Employed as Games developer.   Past Medical History Problems  1. History of BPH (benign prostatic hypertrophy) (N40.0) 2. History of fibromyalgia (Z87.39) 3. History of hyperlipidemia (Z86.39) 4. History  of malignant neoplasm of skin (Z85.828) 5. History of renal calculi (Z87.442) 6. History of Low testosterone (E29.1)  Surgical History Problems  1. History of Cholecystectomy 2. History of Cystoscopy With Biopsy 3. History of Cystoscopy With Fulguration Large Lesion (Over 5cm) 4. History of Cystoscopy With Fulguration Medium Lesion (2-5cm) 5. History of Eye Surgery 6. History of Hemorrhoidectomy 7. History of Transurethral Resection Of Prostate (TURP)  Current Meds 1. AndroGel 50 MG/5GM (1%) Transdermal Gel;  Therapy: [id number] to Recorded 2. DiazePAM 10 MG Oral Tablet; take 1 tablet by mouth 1 hour prior to procedure;  Therapy: [id number] to (Evaluate:08Mar2016)  Requested for: 01Aug2016; Last  Rx:17Feb2016; Status: ACTIVE - Renewal Denied Ordered 3. Finasteride 5 MG Oral Tablet; TAKE 1 TABLET DAILY AS DIRECTED;  Therapy: [id number] to (Evaluate:08May2017)  Requested for: [id number]; Last  Rx:13May2016 Ordered 4. Lovastatin 10 MG Oral Tablet;  Therapy: [id number] to Recorded 5. Oxycodone-Acetaminophen 5-325 MG Oral Tablet; TAKE 1 TABLET 3 times daily for  kidney stone pain;  Therapy: [id number] to (Last Rx:26Apr2016) Ordered  Allergies Medication  1. Hydrocodone-Acetaminophen CAPS 2. Sulfa Drugs 3. Lovastatin TABS 4. Pravastatin Sodium TABS  Family History Problems  1. Family history of lung cancer (Z80.1) : Father  Social History Problems  1. Alcohol use   2 per day 2. Caffeine use (F15.90)   6-8 per day 3. Father deceased 39. Married 5. Mother deceased 57. Never a smoker 7. Occupation   Games developer 8. Three children   daughters  Review of Systems Genitourinary, constitutional, skin, eye, otolaryngeal, hematologic/lymphatic, cardiovascular, pulmonary, endocrine,  musculoskeletal, gastrointestinal, neurological and psychiatric system(s) were reviewed and pertinent findings if present are noted and are otherwise negative.  Genitourinary: nocturia,  hematuria and erectile dysfunction, but no urinary frequency, no feelings of urinary urgency, no difficulty starting the urinary stream, urine stream is not weak, urinary stream does not start and stop, no incomplete emptying of bladder and initiating urination does not require straining.  Gastrointestinal: no nausea, no vomiting, no heartburn, no diarrhea and no constipation.  Constitutional: no fever, no night sweats, not feeling tired (fatigue) and no recent weight loss.  Integumentary: no new skin rashes or lesions and no pruritus.  Eyes: no blurred vision and no diplopia.  ENT: no sore throat and no sinus problems.  Hematologic/Lymphatic: no tendency to easily bruise and no swollen glands.  Cardiovascular: no chest pain and no leg swelling.  Respiratory: no shortness of breath and no cough.  Endocrine: no polydipsia.  Musculoskeletal: joint pain, but no back pain.  Neurological: no headache and no dizziness.  Psychiatric: no anxiety and no depression.    Vitals Vital Signs [Data Includes: Last 1 Day]  Recorded: [id number] 04:15PM  Blood Pressure: 124 / 86 Temperature: 97.8 F Heart Rate: 78  Physical Exam Constitutional: Well nourished and well developed . No acute distress.  ENT:. The ears and nose are normal in appearance.  Neck: The appearance of the neck is normal and no neck mass is present.  Pulmonary: No respiratory distress and normal respiratory rhythm and effort.  Cardiovascular: Heart rate and rhythm are normal . No peripheral edema.  Abdomen: The abdomen is soft and nontender. No masses are palpated. No CVA tenderness. No hernias are palpable. No hepatosplenomegaly noted.  Rectal: Rectal exam demonstrates normal sphincter tone, no tenderness and no masses. The prostate has no nodularity and is not tender. The left seminal vesicle is nonpalpable. The right seminal vesicle is nonpalpable. The perineum is normal on inspection.  Genitourinary: Examination of the penis  demonstrates no discharge, no masses, no lesions and a normal meatus. The scrotum is without lesions. The right epididymis is palpably normal and non-tender. The left epididymis is palpably normal and non-tender. The right testis is non-tender and without masses. The left testis is non-tender and without masses.  Lymphatics: The femoral and inguinal nodes are not enlarged or tender.  Skin: Normal skin turgor, no visible rash and no visible skin lesions.  Neuro/Psych:. Mood and affect are appropriate.    Results/Data Urine [Data Includes: Last 1 Day]   [id number]  COLOR YELLOW   APPEARANCE CLEAR   SPECIFIC GRAVITY 1.010   pH 6.0   GLUCOSE NEGATIVE   BILIRUBIN NEGATIVE   KETONE NEGATIVE   BLOOD NEGATIVE   PROTEIN NEGATIVE   NITRITE NEGATIVE   LEUKOCYTE ESTERASE NEGATIVE    Flow Rate: Voided 519 ml. A peak flow rate of 59ml/s and mean flow rate of 6ml/s.  PVR: Ultrasound PVR 0 ml.    Procedure  Procedure: Cystoscopy  Chaperone Present: kim lewis.  Indication: History of Urothelial Carcinoma.  Informed Consent: Risks, benefits, and potential adverse events were discussed and informed consent was obtained from the patient.  Prep: The patient was prepped with betadine.  Anesthesia:. Local anesthesia was administered intraurethrally with 2% lidocaine jelly.  Antibiotic prophylaxis: Ciprofloxacin.  Procedure Note:  Urethral meatus:. No abnormalities.  Anterior urethra: No abnormalities.  Prostatic urethra:. The lateral prostatic lobes were enlarged. No intravesical median lobe was visualized.  Bladder: The ureteral orifices were in the normal anatomic position bilaterally. A  solitary tumor was visualized in the bladder. A papillary tumor was seen in the bladder measuring approximately 1 cm in size. This tumor was located on the left side, on the posterior aspect of the bladder.    Assessment Assessed  1. Benign nodular prostatic hyperplasia without lower urinary tract symptoms  (N40.0) 2. Malignant neoplasm of lateral wall of urinary bladder (C67.2)  Bladder tumor recurrence with single lesion, 1cm left of the midline of the posterior bladder wall. He has 2 other area of solitary tissue which will be biopsied, and he will also have bilateral retrograde pyelograms.   Plan Benign nodular prostatic hyperplasia without lower urinary tract symptoms  1. PROSTATE U/S; Status:Resulted - Requires Verification;   [id number] 04:53PM 2. PVR U/S; Status:Complete;   [id number] Benign nodular prostatic hyperplasia without lower urinary tract symptoms,   3. Complex Uroflowmetry; Status:Complete;   [id number] Health Maintenance  4. UA With REFLEX; [Do Not Release]; Status:Resulted - Requires Verification;   [id number] 03:47PM  Discussion/Summary cc: Dr. Alen Blew  cc: Hulan Fess, MD     Signatures Electronically signed by : Carolan Clines, M.D.; Sep 23 2015  5:00PM EST

## 2015-10-30 NOTE — Op Note (Addendum)
Pre-operative diagnosis :   Recurrent bladder tumors  Postoperative diagnosis:  Same with bladder tumors on left lateral wall (2 cm) and left posterior bladder wall (1 cm)  Operation:  Cystourethroscopy, Tauber excisional biopsy of left lateral wall 2 cm bladder tumor and left posterior bladder wall 1 cm bladder tumor  Surgeon:  S. Gaynelle Arabian, MD  First assistant:  None  Anesthesia:  Gen. LMA  Preparation:  After appropriate preanesthesia, the patient was brought the operative room, placed on the operating table in the dorsal supine position where general LMA anesthesia was induced. The left arm was previously marked. The history was reviewed.  Review history:  4 mo cystoscopy   Active Problems Problems  1. Benign nodular prostatic hyperplasia without lower urinary tract symptoms (N40.0) 2. Bilateral kidney stones (N20.0) 3. Bladder mass (N32.89) 4. Gross hematuria (R31.0) 5. Left nephrolithiasis (N20.0) 6. Malignant neoplasm of anterior wall of urinary bladder (C67.3) 7. Malignant neoplasm of dome of urinary bladder (C67.1) 8. Malignant neoplasm of lateral wall of urinary bladder (C67.2) 9. Malignant neoplasm of trigone of urinary bladder (C67.0) 10. Nodular prostate (N40.2) 11. Prostate Hard Area Or Nodule On The Right 12. Pyuria (N39.0) 13. Renal cyst, acquired (N28.1) 14. Stage I papillary adenocarcinoma of bladder (C67.9)  History of Present Illness   70 yo married male returns today for a 4 mo cystoscopy for hx of bladder cancer. He is s/p cysto/TURBT/ cauterization of tumor recurrence on L bladder wall on 11/01/14. Hx of bladder cancer, s/p Mitomycin-C chemo intravesical instillations x 6 weeks, finished on 09/18/14. S/p cysto/TURBT on 07/12/14. He is s/p 6 treatments of BCG instillations (last one was 02/20/14). He is s/p TURBT & TURP on 12/03/13. He has current IPSS=14.     Hx of gross hematuria. Originally referred by Dr. Rex Kras for further evaluation of asymptomatic  gross hematuria & passing blood clots. He states that this happened on 10/05/13 & lasted x 5 days. His urine has since been clear. IPSS= 1 only.      He had a CT on 10/05/13 that showed 6.7cm low density lesion on the Rt renal lower pole, that mostly likely is a renal cyst. It also showed that he had bilateral kidney stones. Stones in the Lt kidney are larger than the Rt.    Low dose ASA, but off since Dec 17th. No tobacco (neg history). Employed as Games developer.   Past Medical History Problems  1. History of BPH (benign prostatic hypertrophy) (N40.0) 2. History of fibromyalgia (Z87.39) 3. History of hyperlipidemia (Z86.39) 4. History of malignant neoplasm of skin (Z85.828) 5. History of renal calculi (Z87.442) 6. History of Low testosterone (E29.1)  Surgical History Problems  1. History of Cholecystectomy 2. History of Cystoscopy With Biopsy 3. History of Cystoscopy With Fulguration Large Lesion (Over 5cm) 4. History of Cystoscopy With Fulguration Medium Lesion (2-5cm) 5. History of Eye Surgery 6. History of Hemorrhoidectomy 7. History of Transurethral Resection Of Prostate (TURP)  Statement of  Likelihood of Success: Excellent. TIME-OUT observed.:  Procedure:  Cystourethroscopy was accomplished, and left lateral bladder wall tumor was identified in the upper left portion of the lateral wall, and the left posterior bladder wall tumor was identified as well.    Bilateral retrograde pyelogram was then performed, showing normal appearing ureters, and normal renal pelvis. On both sides, the patient had a bifid renal pelvis, with separation of the upper pole and the mid and lower poles. The calyces were noted to be sharp, and the renal pelvis  was normal bilaterally. The renal pelvis outline was normal bilaterally. The infundibulopelvic were also normal bilaterally. The ureters were normal bilaterally, and there was no evidence of mass in the ureter or the renal pelvis  bilaterally.      The  left lateral bladder wall tumor was 2 cm in size, and the left posterior bladder wall tumor was identified and was 1 cm in size. These were both removed using the Tauber biopsy forceps. minimal bleeding was noted.  Foley catheter, 44 [place]) with 10 mL in balloon was placed in the bladder, the patient was awakened, and taken to recovery room for that mitomycin-C could be placed.  In Recovery Room, Mitomycin-C, 40mg  in 40cc irrigant placed in bladder, and left for 1 hr. The bladder then is irrigated with NS and foley catheter is removed.

## 2015-10-30 NOTE — Interval H&P Note (Signed)
History and Physical Interval Note:  10/30/2015 10:50 AM  Jeffrey Horn  has presented today for surgery, with the diagnosis of LEFT LATERAL WALL BLADDER TUMOR, BLADDER CANCER  The various methods of treatment have been discussed with the patient and family. After consideration of risks, benefits and other options for treatment, the patient has consented to  Procedure(s) with comments: TRANSURETHRAL RESECTION OF BLADDER TUMOR (TURBT) (N/A) - BIOPSY    CYSTOSCOPY WITH RETROGRADE PYELOGRAM (Bilateral) as a surgical intervention .  The patient's history has been reviewed, patient examined, no change in status, stable for surgery.  I have reviewed the patient's chart and labs.  Questions were answered to the patient's satisfaction.     Ailene Rud  Urology: Discussed with pt. Left side marked: plan for bilateral retrograde pyelograms, bx and TU-BT and possible Mitomycin -C in recovery room, if possible.

## 2015-10-31 ENCOUNTER — Encounter (HOSPITAL_BASED_OUTPATIENT_CLINIC_OR_DEPARTMENT_OTHER): Payer: Self-pay | Admitting: Urology

## 2015-11-21 DIAGNOSIS — C670 Malignant neoplasm of trigone of bladder: Secondary | ICD-10-CM | POA: Diagnosis not present

## 2015-11-21 DIAGNOSIS — Z0000 Encounter for general adult medical examination without abnormal findings: Secondary | ICD-10-CM | POA: Diagnosis not present

## 2015-11-21 DIAGNOSIS — C674 Malignant neoplasm of posterior wall of bladder: Secondary | ICD-10-CM | POA: Diagnosis not present

## 2015-11-21 DIAGNOSIS — C672 Malignant neoplasm of lateral wall of bladder: Secondary | ICD-10-CM | POA: Diagnosis not present

## 2015-12-01 ENCOUNTER — Encounter: Payer: Self-pay | Admitting: *Deleted

## 2015-12-01 DIAGNOSIS — Z79899 Other long term (current) drug therapy: Secondary | ICD-10-CM | POA: Diagnosis not present

## 2015-12-01 DIAGNOSIS — E7800 Pure hypercholesterolemia, unspecified: Secondary | ICD-10-CM | POA: Diagnosis not present

## 2015-12-01 DIAGNOSIS — N402 Nodular prostate without lower urinary tract symptoms: Secondary | ICD-10-CM | POA: Diagnosis not present

## 2015-12-04 DIAGNOSIS — Z8551 Personal history of malignant neoplasm of bladder: Secondary | ICD-10-CM | POA: Diagnosis not present

## 2015-12-04 DIAGNOSIS — R7301 Impaired fasting glucose: Secondary | ICD-10-CM | POA: Diagnosis not present

## 2015-12-04 DIAGNOSIS — E7800 Pure hypercholesterolemia, unspecified: Secondary | ICD-10-CM | POA: Diagnosis not present

## 2015-12-04 DIAGNOSIS — Z0000 Encounter for general adult medical examination without abnormal findings: Secondary | ICD-10-CM | POA: Diagnosis not present

## 2015-12-04 DIAGNOSIS — L570 Actinic keratosis: Secondary | ICD-10-CM | POA: Diagnosis not present

## 2015-12-17 DIAGNOSIS — Z0000 Encounter for general adult medical examination without abnormal findings: Secondary | ICD-10-CM | POA: Diagnosis not present

## 2015-12-17 DIAGNOSIS — Z5111 Encounter for antineoplastic chemotherapy: Secondary | ICD-10-CM | POA: Diagnosis not present

## 2015-12-17 DIAGNOSIS — C674 Malignant neoplasm of posterior wall of bladder: Secondary | ICD-10-CM | POA: Diagnosis not present

## 2015-12-17 DIAGNOSIS — C670 Malignant neoplasm of trigone of bladder: Secondary | ICD-10-CM | POA: Diagnosis not present

## 2015-12-17 DIAGNOSIS — C672 Malignant neoplasm of lateral wall of bladder: Secondary | ICD-10-CM | POA: Diagnosis not present

## 2015-12-24 DIAGNOSIS — C674 Malignant neoplasm of posterior wall of bladder: Secondary | ICD-10-CM | POA: Diagnosis not present

## 2015-12-24 DIAGNOSIS — Z0000 Encounter for general adult medical examination without abnormal findings: Secondary | ICD-10-CM | POA: Diagnosis not present

## 2015-12-24 DIAGNOSIS — R310 Gross hematuria: Secondary | ICD-10-CM | POA: Diagnosis not present

## 2015-12-24 DIAGNOSIS — C670 Malignant neoplasm of trigone of bladder: Secondary | ICD-10-CM | POA: Diagnosis not present

## 2015-12-24 DIAGNOSIS — C671 Malignant neoplasm of dome of bladder: Secondary | ICD-10-CM | POA: Diagnosis not present

## 2015-12-24 DIAGNOSIS — C672 Malignant neoplasm of lateral wall of bladder: Secondary | ICD-10-CM | POA: Diagnosis not present

## 2015-12-31 DIAGNOSIS — C671 Malignant neoplasm of dome of bladder: Secondary | ICD-10-CM | POA: Diagnosis not present

## 2015-12-31 DIAGNOSIS — C670 Malignant neoplasm of trigone of bladder: Secondary | ICD-10-CM | POA: Diagnosis not present

## 2015-12-31 DIAGNOSIS — Z0000 Encounter for general adult medical examination without abnormal findings: Secondary | ICD-10-CM | POA: Diagnosis not present

## 2015-12-31 DIAGNOSIS — C674 Malignant neoplasm of posterior wall of bladder: Secondary | ICD-10-CM | POA: Diagnosis not present

## 2015-12-31 DIAGNOSIS — C673 Malignant neoplasm of anterior wall of bladder: Secondary | ICD-10-CM | POA: Diagnosis not present

## 2015-12-31 DIAGNOSIS — C672 Malignant neoplasm of lateral wall of bladder: Secondary | ICD-10-CM | POA: Diagnosis not present

## 2016-01-07 DIAGNOSIS — C674 Malignant neoplasm of posterior wall of bladder: Secondary | ICD-10-CM | POA: Diagnosis not present

## 2016-01-07 DIAGNOSIS — C673 Malignant neoplasm of anterior wall of bladder: Secondary | ICD-10-CM | POA: Diagnosis not present

## 2016-01-07 DIAGNOSIS — C671 Malignant neoplasm of dome of bladder: Secondary | ICD-10-CM | POA: Diagnosis not present

## 2016-01-07 DIAGNOSIS — C670 Malignant neoplasm of trigone of bladder: Secondary | ICD-10-CM | POA: Diagnosis not present

## 2016-01-07 DIAGNOSIS — C672 Malignant neoplasm of lateral wall of bladder: Secondary | ICD-10-CM | POA: Diagnosis not present

## 2016-01-07 DIAGNOSIS — Z0000 Encounter for general adult medical examination without abnormal findings: Secondary | ICD-10-CM | POA: Diagnosis not present

## 2016-01-13 DIAGNOSIS — M722 Plantar fascial fibromatosis: Secondary | ICD-10-CM | POA: Diagnosis not present

## 2016-01-13 DIAGNOSIS — M79671 Pain in right foot: Secondary | ICD-10-CM | POA: Diagnosis not present

## 2016-01-14 DIAGNOSIS — R310 Gross hematuria: Secondary | ICD-10-CM | POA: Diagnosis not present

## 2016-01-14 DIAGNOSIS — Z0000 Encounter for general adult medical examination without abnormal findings: Secondary | ICD-10-CM | POA: Diagnosis not present

## 2016-01-14 DIAGNOSIS — Z5111 Encounter for antineoplastic chemotherapy: Secondary | ICD-10-CM | POA: Diagnosis not present

## 2016-01-14 DIAGNOSIS — C672 Malignant neoplasm of lateral wall of bladder: Secondary | ICD-10-CM | POA: Diagnosis not present

## 2016-01-21 DIAGNOSIS — Z0000 Encounter for general adult medical examination without abnormal findings: Secondary | ICD-10-CM | POA: Diagnosis not present

## 2016-01-21 DIAGNOSIS — Z5111 Encounter for antineoplastic chemotherapy: Secondary | ICD-10-CM | POA: Diagnosis not present

## 2016-01-21 DIAGNOSIS — C672 Malignant neoplasm of lateral wall of bladder: Secondary | ICD-10-CM | POA: Diagnosis not present

## 2016-02-04 DIAGNOSIS — C673 Malignant neoplasm of anterior wall of bladder: Secondary | ICD-10-CM | POA: Diagnosis not present

## 2016-02-04 DIAGNOSIS — Z0000 Encounter for general adult medical examination without abnormal findings: Secondary | ICD-10-CM | POA: Diagnosis not present

## 2016-02-04 DIAGNOSIS — C674 Malignant neoplasm of posterior wall of bladder: Secondary | ICD-10-CM | POA: Diagnosis not present

## 2016-02-04 DIAGNOSIS — C672 Malignant neoplasm of lateral wall of bladder: Secondary | ICD-10-CM | POA: Diagnosis not present

## 2016-02-04 DIAGNOSIS — C670 Malignant neoplasm of trigone of bladder: Secondary | ICD-10-CM | POA: Diagnosis not present

## 2016-02-04 DIAGNOSIS — C671 Malignant neoplasm of dome of bladder: Secondary | ICD-10-CM | POA: Diagnosis not present

## 2016-02-08 IMAGING — RF DG RETROGRADE PYELOGRAM
1 series · 9 of 9 positions shown · non-contrast
Comparison: None.

CLINICAL DATA: Bladder tumor

EXAM:
RETROGRADE PYELOGRAM

[Series 1: run · 9 of 9 slices shown]
[im 1/9]
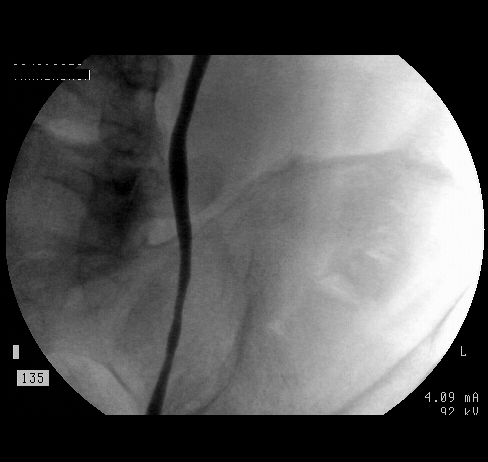
[im 2/9]
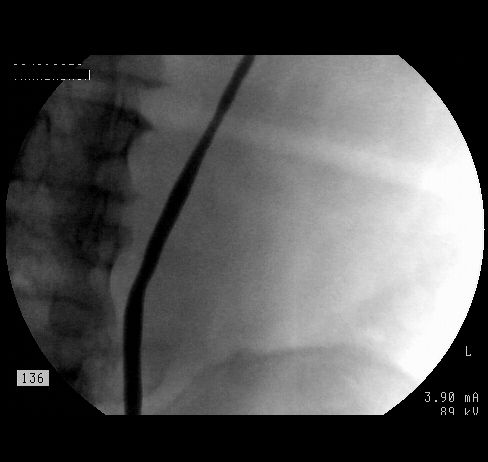
[im 3/9]
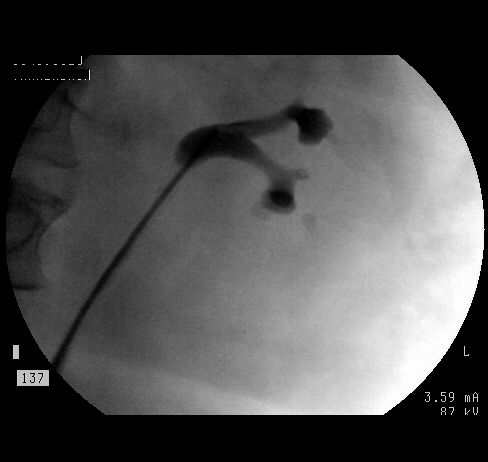
[im 4/9]
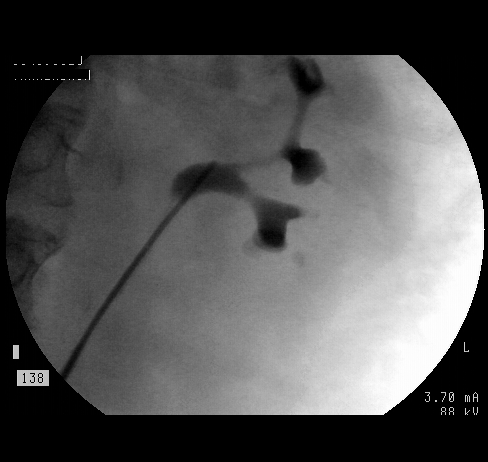
[im 5/9]
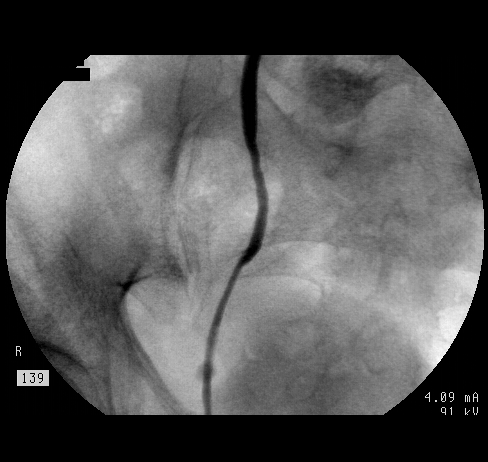
[im 6/9]
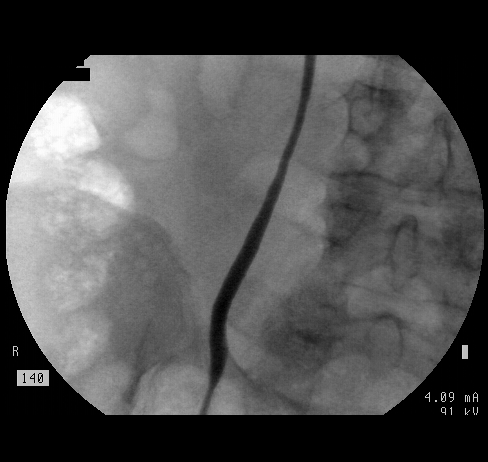
[im 7/9]
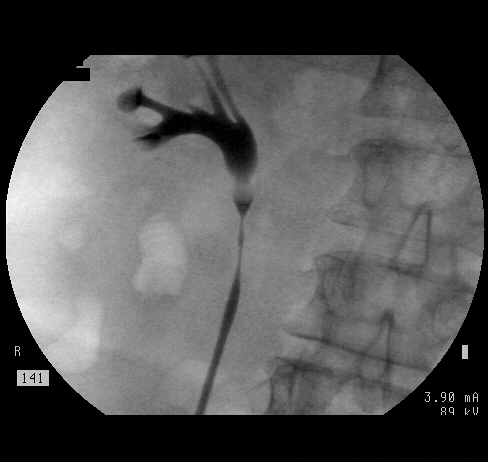
[im 8/9]
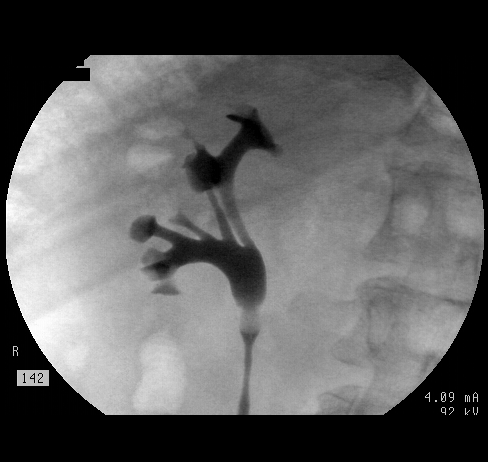
[im 9/9]
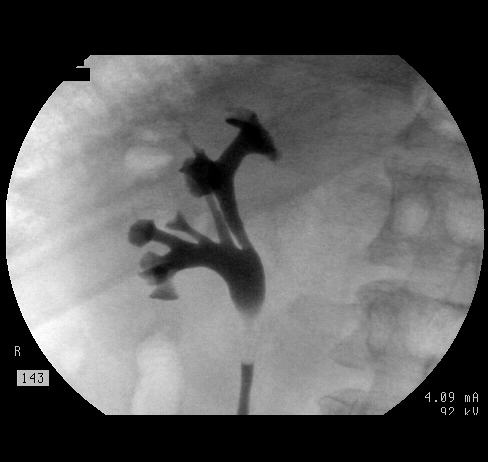

[9 of 9 positions shown; findings below may reference images not displayed]

FLUOROSCOPY TIME:  Radiation Exposure Index (as provided by the
fluoroscopic device): Not available

If the device does not provide the exposure index:

Fluoroscopy Time:  36 seconds

Number of Acquired Images:  9
FINDINGS: The injection in the left ureter shows no obstructive changes.
Injection on the right shows no obstructive changes. No filling
defects are noted.
IMPRESSION: No abnormalities within the collecting systems bilaterally.

## 2016-02-09 ENCOUNTER — Encounter: Payer: Self-pay | Admitting: *Deleted

## 2016-04-28 DIAGNOSIS — C672 Malignant neoplasm of lateral wall of bladder: Secondary | ICD-10-CM | POA: Diagnosis not present

## 2016-04-30 DIAGNOSIS — N3000 Acute cystitis without hematuria: Secondary | ICD-10-CM | POA: Diagnosis not present

## 2016-04-30 DIAGNOSIS — C672 Malignant neoplasm of lateral wall of bladder: Secondary | ICD-10-CM | POA: Diagnosis not present

## 2016-04-30 DIAGNOSIS — R300 Dysuria: Secondary | ICD-10-CM | POA: Diagnosis not present

## 2016-05-04 ENCOUNTER — Encounter: Payer: Self-pay | Admitting: *Deleted

## 2016-05-04 DIAGNOSIS — C672 Malignant neoplasm of lateral wall of bladder: Secondary | ICD-10-CM | POA: Diagnosis not present

## 2016-05-04 DIAGNOSIS — C671 Malignant neoplasm of dome of bladder: Secondary | ICD-10-CM | POA: Diagnosis not present

## 2016-05-14 DIAGNOSIS — C672 Malignant neoplasm of lateral wall of bladder: Secondary | ICD-10-CM | POA: Diagnosis not present

## 2016-05-18 DIAGNOSIS — C673 Malignant neoplasm of anterior wall of bladder: Secondary | ICD-10-CM | POA: Diagnosis not present

## 2016-05-18 DIAGNOSIS — C672 Malignant neoplasm of lateral wall of bladder: Secondary | ICD-10-CM | POA: Diagnosis not present

## 2016-05-18 DIAGNOSIS — C671 Malignant neoplasm of dome of bladder: Secondary | ICD-10-CM | POA: Diagnosis not present

## 2016-05-19 DIAGNOSIS — C672 Malignant neoplasm of lateral wall of bladder: Secondary | ICD-10-CM | POA: Diagnosis not present

## 2016-05-20 DIAGNOSIS — W548XXA Other contact with dog, initial encounter: Secondary | ICD-10-CM | POA: Diagnosis not present

## 2016-05-20 DIAGNOSIS — S60511A Abrasion of right hand, initial encounter: Secondary | ICD-10-CM | POA: Diagnosis not present

## 2016-05-27 DIAGNOSIS — N202 Calculus of kidney with calculus of ureter: Secondary | ICD-10-CM | POA: Diagnosis not present

## 2016-05-27 DIAGNOSIS — R1084 Generalized abdominal pain: Secondary | ICD-10-CM | POA: Diagnosis not present

## 2016-05-27 DIAGNOSIS — N201 Calculus of ureter: Secondary | ICD-10-CM | POA: Diagnosis not present

## 2016-05-31 ENCOUNTER — Other Ambulatory Visit: Payer: Self-pay | Admitting: Urology

## 2016-06-07 ENCOUNTER — Encounter (HOSPITAL_COMMUNITY): Payer: Self-pay | Admitting: *Deleted

## 2016-06-10 ENCOUNTER — Encounter (HOSPITAL_COMMUNITY): Payer: Self-pay | Admitting: *Deleted

## 2016-06-10 ENCOUNTER — Encounter (HOSPITAL_COMMUNITY)
Admission: RE | Disposition: A | Discharge status: Home / Self Care | Payer: Self-pay | Source: Ambulatory Visit | Attending: Urology

## 2016-06-10 ENCOUNTER — Ambulatory Visit (HOSPITAL_COMMUNITY): Payer: Medicare Other

## 2016-06-10 ENCOUNTER — Ambulatory Visit (HOSPITAL_COMMUNITY)
Admission: RE | Admit: 2016-06-10 | Discharge: 2016-06-10 | Disposition: A | Discharge status: Home / Self Care | Payer: Medicare Other | Source: Ambulatory Visit | Attending: Urology | Admitting: Urology

## 2016-06-10 DIAGNOSIS — Z79899 Other long term (current) drug therapy: Secondary | ICD-10-CM | POA: Insufficient documentation

## 2016-06-10 DIAGNOSIS — E785 Hyperlipidemia, unspecified: Secondary | ICD-10-CM | POA: Diagnosis not present

## 2016-06-10 DIAGNOSIS — N201 Calculus of ureter: Secondary | ICD-10-CM | POA: Insufficient documentation

## 2016-06-10 DIAGNOSIS — Z01818 Encounter for other preprocedural examination: Secondary | ICD-10-CM | POA: Diagnosis not present

## 2016-06-10 DIAGNOSIS — Z8551 Personal history of malignant neoplasm of bladder: Secondary | ICD-10-CM | POA: Diagnosis not present

## 2016-06-10 DIAGNOSIS — N200 Calculus of kidney: Secondary | ICD-10-CM | POA: Diagnosis present

## 2016-06-10 DIAGNOSIS — Z79891 Long term (current) use of opiate analgesic: Secondary | ICD-10-CM | POA: Insufficient documentation

## 2016-06-10 SURGERY — LITHOTRIPSY, ESWL
Anesthesia: LOCAL | Laterality: Left

## 2016-06-10 MED ORDER — CIPROFLOXACIN HCL 500 MG PO TABS
500.0000 mg | ORAL_TABLET | ORAL | Status: AC
  Administered 2016-06-10: 500 mg via ORAL
  Filled 2016-06-10: qty 1

## 2016-06-10 MED ORDER — DIPHENHYDRAMINE HCL 25 MG PO CAPS
25.0000 mg | ORAL_CAPSULE | ORAL | Status: AC
  Administered 2016-06-10: 25 mg via ORAL
  Filled 2016-06-10: qty 1

## 2016-06-10 MED ORDER — SODIUM CHLORIDE 0.9 % IV SOLN
INTRAVENOUS | Status: DC
  Administered 2016-06-10: 10:00:00 via INTRAVENOUS

## 2016-06-10 MED ORDER — DIAZEPAM 5 MG PO TABS
10.0000 mg | ORAL_TABLET | ORAL | Status: AC
  Administered 2016-06-10: 10 mg via ORAL
  Filled 2016-06-10: qty 2

## 2016-06-10 NOTE — Discharge Instructions (Addendum)
Dietary Guidelines to Help Prevent Kidney Stones Your risk of kidney stones can be decreased by adjusting the foods you eat. The most important thing you can do is drink enough fluid. You should drink enough fluid to keep your urine clear or pale yellow. The following guidelines provide specific information for the type of kidney stone you have had. GUIDELINES ACCORDING TO TYPE OF KIDNEY STONE Calcium Oxalate Kidney Stones  Reduce the amount of salt you eat. Foods that have a lot of salt cause your body to release excess calcium into your urine. The excess calcium can combine with a substance called oxalate to form kidney stones.  Reduce the amount of animal protein you eat if the amount you eat is excessive. Animal protein causes your body to release excess calcium into your urine. Ask your dietitian how much protein from animal sources you should be eating.  Avoid foods that are high in oxalates. If you take vitamins, they should have less than 500 mg of vitamin C. Your body turns vitamin C into oxalates. You do not need to avoid fruits and vegetables high in vitamin C. Calcium Phosphate Kidney Stones  Reduce the amount of salt you eat to help prevent the release of excess calcium into your urine.  Reduce the amount of animal protein you eat if the amount you eat is excessive. Animal protein causes your body to release excess calcium into your urine. Ask your dietitian how much protein from animal sources you should be eating.  Get enough calcium from food or take a calcium supplement (ask your dietitian for recommendations). Food sources of calcium that do not increase your risk of kidney stones include:  Broccoli.  Dairy products, such as cheese and yogurt.  Pudding. Uric Acid Kidney Stones  Do not have more than 6 oz of animal protein per day. FOOD SOURCES Animal Protein Sources  Meat (all types).  Poultry.  Eggs.  Fish, seafood. Foods High in [employer] seasonings.  Soy  sauce.  Teriyaki sauce.  Cured and processed meats.  Salted crackers and snack foods.  Fast food.  Canned soups and most canned foods. Foods High in Oxalates  Grains:  Amaranth.  Barley.  Grits.  Wheat germ.  Bran.  Buckwheat flour.  All bran cereals.  Pretzels.  Whole wheat bread.  Vegetables:  Beans (wax).  Beets and beet greens.  Collard greens.  Eggplant.  [place].  Leeks.  Okra.  Parsley.  [place]abagas.  Spinach.  Swiss chard.  Tomato paste.  Fried potatoes.  Sweet potatoes.  Fruits:  Red currants.  Figs.  Kiwi.  Rhubarb.  Meat and Other Protein Sources:  Beans (dried).  Soy burgers and other soybean products.  Miso.  Nuts (peanuts, almonds, pecans, cashews, hazelnuts).  Nut butters.  Sesame seeds and tahini (paste made of sesame seeds).  Poppy seeds.  Beverages:  Chocolate drink mixes.  Soy milk.  Instant iced tea.  Juices made from high-oxalate fruits or vegetables.  Other:  Carob.  Chocolate.  Fruitcake.  Marmalades.   This information is not intended to replace advice given to you by your health care provider. Make sure you discuss any questions you have with your health care provider.   Document Released: 01/29/2011 Document Revised: 10/09/2013 Document Reviewed: 08/31/2013 Elsevier Interactive Patient Education 2016 [place], Care After Refer to this sheet in the next few weeks. These instructions provide you with information on caring for yourself after your procedure. Your health care provider may  also give you more specific instructions. Your treatment has been planned according to current medical practices, but problems sometimes occur. Call your health care provider if you have any problems or questions after your procedure. WHAT TO EXPECT AFTER THE PROCEDURE   Your urine may have a red tinge for a few days after treatment. Blood loss is usually minimal.  You may  have soreness in the back or flank area. This usually goes away after a few days. The procedure can cause blotches or bruises on the back where the pressure wave enters the skin. These marks usually cause only minimal discomfort and should disappear in a short time.  Stone fragments should begin to pass within 24 hours of treatment. However, a delayed passage is not unusual.  You may have pain, discomfort, and feel sick to your stomach (nauseated) when the crushed fragments of stone are passed down the tube from the kidney to the bladder. Stone fragments can pass soon after the procedure and may last for up to 4-8 weeks.  A small number of patients may have severe pain when stone fragments are not able to pass, which leads to an obstruction.  If your stone is greater than 1 inch (2.5 cm) in diameter or if you have multiple stones that have a combined diameter greater than 1 inch (2.5 cm), you may require more than one treatment.  If you had a stent placed prior to your procedure, you may experience some discomfort, especially during urination. You may experience the pain or discomfort in your flank or back, or you may experience a sharp pain or discomfort at the base of your penis or in your lower abdomen. The discomfort usually lasts only a few minutes after urinating. HOME CARE INSTRUCTIONS   Rest at home until you feel your energy improving.  Only take over-the-counter or prescription medicines for pain, discomfort, or fever as directed by your health care provider. Depending on the type of lithotripsy, you may need to take antibiotics and anti-inflammatory medicines for a few days.  Drink enough water and fluids to keep your urine clear or pale yellow. This helps "flush" your kidneys. It helps pass any remaining pieces of stone and prevents stones from coming back.  Most people can resume daily activities within 1-2 days after standard lithotripsy. It can take longer to recover from laser and  percutaneous lithotripsy.  Strain all urine through the provided strainer. Keep all particulate matter and stones for your health care provider to see. The stone may be as small as a grain of salt. It is very important to use the strainer each and every time you pass your urine. Any stones that are found can be sent to a medical lab for examination.  Visit your health care provider for a follow-up appointment in a few weeks. Your doctor may remove your stent if you have one. Your health care provider will also check to see whether stone particles still remain. SEEK MEDICAL CARE IF:   Your pain is not relieved by medicine.  You have a lasting nauseous feeling.  You feel there is too much blood in the urine.  You develop persistent problems with frequent or painful urination that does not at least partially improve after 2 days following the procedure.  You have a congested cough.  You feel lightheaded.  You develop a rash or any other signs that might suggest an allergic problem.  You develop any reaction or side effects to your medicine(s). SEEK IMMEDIATE  MEDICAL CARE IF:   You experience severe back or flank pain or both.  You see nothing but blood when you urinate.  You cannot pass any urine at all.  You have a fever or shaking chills.  You develop shortness of breath, difficulty breathing, or chest pain.  You develop vomiting that will not stop after 6-8 hours.  You have a fainting episode.   This information is not intended to replace advice given to you by your health care provider. Make sure you discuss any questions you have with your health care provider.   Document Released: 10/24/2007 Document Revised: 06/25/2015 Document Reviewed: 04/19/2013 Elsevier Interactive Patient Education 2016 [place].  Moderate Conscious Sedation, Adult, Care After Refer to this sheet in the next few weeks. These instructions provide you with information on caring for yourself  after your procedure. Your health care provider may also give you more specific instructions. Your treatment has been planned according to current medical practices, but problems sometimes occur. Call your health care provider if you have any problems or questions after your procedure. WHAT TO EXPECT AFTER THE PROCEDURE  After your procedure:  You may feel sleepy, clumsy, and have poor balance for several hours.  Vomiting may occur if you eat too soon after the procedure. HOME CARE INSTRUCTIONS  Do not participate in any activities where you could become injured for at least 24 hours. Do not:  Drive.  Swim.  Ride a bicycle.  Operate heavy machinery.  Cook.  Use power tools.  Climb ladders.  Work from a high place.  Do not make important decisions or sign legal documents until you are improved.  If you vomit, drink water, juice, or soup when you can drink without vomiting. Make sure you have little or no nausea before eating solid foods.  Only take over-the-counter or prescription medicines for pain, discomfort, or fever as directed by your health care provider.  Make sure you and your family fully understand everything about the medicines given to you, including what side effects may occur.  You should not drink alcohol, take sleeping pills, or take medicines that cause drowsiness for at least 24 hours.  If you smoke, do not smoke without supervision.  If you are feeling better, you may resume normal activities 24 hours after you were sedated.  Keep all appointments with your health care provider. SEEK MEDICAL CARE IF:  Your skin is pale or bluish in color.  You continue to feel nauseous or vomit.  Your pain is getting worse and is not helped by medicine.  You have bleeding or swelling.  You are still sleepy or feeling clumsy after 24 hours. SEEK IMMEDIATE MEDICAL CARE IF:  You develop a rash.  You have difficulty breathing.  You develop any type of allergic  problem.  You have a fever. MAKE SURE YOU:  Understand these instructions.  Will watch your condition.  Will get help right away if you are not doing well or get worse.   This information is not intended to replace advice given to you by your health care provider. Make sure you discuss any questions you have with your health care provider.   Document Released: 07/25/2013 Document Revised: 10/25/2014 Document Reviewed: 07/25/2013 Elsevier Interactive Patient Education [employer].

## 2016-06-10 NOTE — Interval H&P Note (Signed)
History and Physical Interval Note:  06/10/2016 11:45 AM  Jeffrey Horn  has presented today for surgery, with the diagnosis of LEFT URETERAL STONE  The various methods of treatment have been discussed with the patient and family. After consideration of risks, benefits and other options for treatment, the patient has consented to  Procedure(s): LEFT EXTRACORPOREAL SHOCK WAVE LITHOTRIPSY (ESWL) (Left) as a surgical intervention .  The patient's history has been reviewed, patient examined, no change in status, stable for surgery.  I have reviewed the patient's chart and labs.  Questions were answered to the patient's satisfaction.     [name] I [name]

## 2016-06-10 NOTE — H&P (Signed)
Office Visit Report 05/27/2016    Jeffrey Horn         MRN: [id number]  PRIMARY CARE:  Priscille Heidelberg. Little, MD  DOB: [birth year], 70 year old Male  REFERRING:    SSN: [id number]  PROVIDER:  Carolan Clines, M.D.    TREATING:  Jimmey Ralph    LOCATION:  [facility] [phone]    CC: I have kidney stones.  HPI: Jeffrey Horn is a 70 year-old male established patient who is here for renal calculi.  Patient was seen 05/18/16 for symptom check for dysuria following an in office cystoscopy 04/30/16. He underwent BCG treatment 05/19/16. He had low back pain in the morning of Saturday 05/22/16. It worsened during the day and patient medicated with Oxycodone pain medication he has on hand to take prior to BCG treatments.   Today states that back/flank pain is resolving and today feels the best it has in days since stone has passed.   The problem is on the right side. He first stated noticing pain on 05/25/2016. This is not his first kidney stone. His first stone was approximately 05/18/1996. He has had 2 stones prior to getting this one. He is currently having back pain. He denies having flank pain, groin pain, nausea, vomiting, fever, and chills. He has caught a stone in his urine strainer since his symptoms began.   He has never had surgical treatment for calculi in the past.     ALLERGIES: Hydrocodone-Acetaminophen CAPS - Other Reaction, hallucinations Lovastatin TABS - Other Reaction, joint pain Pravastatin Sodium TABS - Other Reaction, joint pain Sulfa Drugs - Skin Rash   MEDICATIONS: Claritin 10 mg capsule 1 capsule PO Daily  DiazePAM 10 MG Oral Tablet 0 Oral  Lovastatin 1 PO Daily  Oxycodone Hcl 5 mg capsule 1 capsule PO Q 6 H    GU PSH: Bladder Instill AntiCA Agent - 05/19/2016, 11/07/2015 Cysto Bladder Ureth Biopsy - 2016 Cystoscopy - 04/28/2016 Cystoscopy TURBT <2 cm - 11/07/2015 Cystoscopy TURBT >5 cm - 2015 Cystoscopy TURBT 2-5 cm - 07/16/2014 Cystoscopy TURP - 2015     PSH Notes: Bladder Injection Of Cancer Treatment, Cystoscopy With Fulguration Small Lesion (5-12mm), Cystoscopy With Biopsy, Cystoscopy With Fulguration Medium Lesion (2-5cm), Cystoscopy With Fulguration Large Lesion (Over 5cm), Transurethral Resection Of Prostate (TURP), Hemorrhoidectomy, Eye Surgery, Cholecystectomy  NON-GU PSH: Cholecystectomy - 2015 Ligate Hemorrhoid(s) - 2015        GU PMH: Acute Cystitis - 04/30/2016 Bladder Cancer Lateral (Stable) - 04/30/2016, Bladder Cancer Lateral - 04/28/2016, Malignant neoplasm of lateral wall of urinary bladder, - 02/04/2016 Dysuria - 04/30/2016 Bladder Cancer Anterior, Malignant neoplasm of anterior wall of urinary bladder - 02/04/2016 Bladder Cancer Dome, Malignant neoplasm of dome of urinary bladder - 02/04/2016 Bladder Cancer Posterior, Malignant neoplasm of posterior wall of urinary bladder - 02/04/2016 Bladder Cancer Trigone , Malignant neoplasm of trigone of urinary bladder - 02/04/2016 BPH w/o LUTS, Benign nodular prostatic hyperplasia without lower urinary tract symptoms - 09/23/2015, BPH (benign prostatic hypertrophy), - 2015 Bladder Cancer, Unspec, Stage I papillary adenocarcinoma of bladder - 05/19/2015 Other Disorders Of Bladder, Bladder mass - 05/19/2015 Urinary Tract Inf, Unspec site, Pyuria - 2015 Kidney Stone, Left nephrolithiasis - 2015, Bilateral kidney stones, - 2015 Renal Cysts, Simple, Renal cyst, acquired - 2015 Gross hematuria, Gross hematuria - 2015 Nodular prostate w/o LUTS, Nodular prostate - 2015 Personal Hx urinary calculi, History of renal calculi - 2015 Testicular hypofunction, Low testosterone -  43      PMH Notes:  2013-10-30 11:05:26 - Note: Prostate Hard Area Or Nodule On The Right   NON-GU PMH: Abnormal findings on diagnostic imaging of other specified body structures, Abnormal ultrasound of prostate - 10/02/2015 Encounter for general adult medical examination without abnormal findings, Encounter for preventive  health examination - 2015 Personal history of other diseases of the musculoskeletal system and connective tissue, History of fibromyalgia - 2015 Personal history of other endocrine, nutritional and metabolic disease, History of hyperlipidemia - 2015 Personal history of other malignant neoplasm of skin, History of malignant neoplasm of skin - 2015    FAMILY HISTORY: Lung Cancer - Runs In Family   SOCIAL HISTORY: Marital Status: Married Current Smoking Status: Patient has never smoked.  Drinks 2 drinks per day.  Drinks 4+ caffeinated drinks per day.     Notes: Caffeine use, Alcohol use, Mother deceased, Father deceased, Three children, Never a smoker, Occupation, Married   REVIEW OF SYSTEMS:     GU Review Male:  Patient denies frequent urination, hard to postpone urination, burning/ pain with urination, get up at night to urinate, leakage of urine, stream starts and stops, trouble starting your stream, have to strain to urinate , erection problems, and penile pain.    Gastrointestinal (Upper):  Patient denies nausea, vomiting, and indigestion/ heartburn.    Gastrointestinal (Lower):  Patient denies diarrhea and constipation.    Constitutional:  Patient denies fever, night sweats, weight loss, and fatigue.    Skin:  Patient denies skin rash/ lesion and itching.    Eyes:  Patient denies blurred vision and double vision.    Ears/ Nose/ Throat:  Patient denies sore throat and sinus problems.    Hematologic/Lymphatic:  Patient denies swollen glands and easy bruising.    Cardiovascular:  Patient denies chest pains and leg swelling.    Respiratory:  Patient denies cough and shortness of breath.    Endocrine:  Patient denies excessive thirst.    Musculoskeletal:  Patient reports back pain. Patient denies joint pain.    Neurological:  Patient denies headaches and dizziness.    Psychologic:  Patient denies depression and anxiety.    VITAL SIGNS:       05/27/2016 09:49 AM     BP 137/89 mmHg       Pulse 70 /min     Temperature 97.9 F / 37 C     MULTI-SYSTEM PHYSICAL EXAMINATION:      Constitutional: Well-nourished. No physical deformities. Normally developed. Good grooming.      Respiratory: No labored breathing, no use of accessory muscles.      Cardiovascular: Normal temperature, normal extremity pulses, no swelling, no varicosities.      Neurologic / Psychiatric: Oriented to time, oriented to place, oriented to person. No depression, no anxiety, no agitation.      Gastrointestinal: No mass, no tenderness, no rigidity, non obese abdomen. No CVA tenderness. Pain is at waist line.     Ears, Nose, Mouth, and Throat: Mallimapti Class 3            PAST DATA REVIEWED:   Source Of History:  Patient  Records Review:  Previous Patient Records  Urine Test Review:  Urinalysis    10/30/13 07/06/06 08/06/05 06/04/04 11/26/03  PSA  Total PSA 1.67  0.63  0.65  0.87  0.74     PROCEDURES:    KUB - 74000  A single view of the abdomen is obtained.  Calculi:  Upper pole left  kidney calculi. Middle pole left kidney calculi. Lower pole right kidney calculi. Mid left ureter calculi.      Left mid ureteral calculus at L2-3. Multiple pelvic calcifications noted. No definitive distal bilateral ureteral calculus noted.      Renal Ultrasound - [id number]  Right Kidney: Length:11.1 cm Depth: 5.7 cm Cortical Width: 1.5 cm Width:5.3 cm  Left Kidney: Length:12.0 cm Depth:6.0 cm Cortical Width: 1.6 cm Width: 5.7 cm  Left Kidney/Ureter:  Multiple stones 1) Mp= 0.61cm 2) MP= 0.67cm 3) MP=0.18cm 4) MP=0.21cm  Right Kidney/Ureter:  Cystic area LP= 6.4x5.9x5.3cm. ? Multiple stones 1) LP= 0.65cm 2) UP= 0.44cm  Bladder:  PVR= 88.60. Left jet seen, ? Right jet seen?          Urinalysis - 81003  Dipstick Dipstick Cont'd  Specimen: Voided Bilirubin: Neg  Color: Yellow Ketones: Neg  Appearance: Clear Blood: Neg  Specific Gravity: 1.025 Protein: Neg  pH: 5.5 Urobilinogen: 0.2  Glucose: Neg  Nitrites: Neg   Leukocyte Esterase: Neg    ASSESSMENT:     ICD-10 Details  1 GU:  Flank Pain - R10.84 Right, Acute - Left mid ureteral calculus at L2-3  2  Calculus Kidney and Ureter - N20.2 Bilateral, Chronic - Left mid ureteral calculus at L2-3. Recommend ESWL   PLAN:   Medications  Stop Meds: AndroGel 50 MG/5GM (1%) Transdermal Gel Transdermal Start: 10/30/2013  Discontinue: 05/27/2016 - Reason: The medication cycle was completed.  Uribel 118 MG Oral Capsule 0 Oral Start: 10/30/2015  Discontinue: 05/27/2016 - Reason: The medication cycle was completed.    Orders  Labs Stone Analysis  X-Rays: KUB  X-Ray Notes: Right  Schedule  X-Rays: Renal Ultrasound  Document  Letter(s):  Created for Patient: Clinical Summary   Notes:   I went over the procedure of extracorporal shockwave lithotripsy with the patient and wife in quite detail. He understands the risk of poor fragmentation resulting in further ureteral intervention. He also understands that possibility of needing additional procedures to clear stones. Finally, we discussed the risks of hematoma. The patient is willing to proceed.      Signed by Jimmey Ralph on 05/27/16 at 11:12 AM (EDT)  The information contained in this medical record document is considered private and confidential patient information. This information can only be used for the medical diagnosis and/or medical services that are being provided by the patient's selected caregivers. This information can only be distributed outside of the patient's care if the patient agrees and signs waivers of authorization for this information to be sent to an outside source or route.

## 2016-06-10 NOTE — Interval H&P Note (Signed)
History and Physical Interval Note:  06/10/2016 11:46 AM  Jeffrey Horn  has presented today for surgery, with the diagnosis of LEFT URETERAL STONE  The various methods of treatment have been discussed with the patient and family. After consideration of risks, benefits and other options for treatment, the patient has consented to  Procedure(s): LEFT EXTRACORPOREAL SHOCK WAVE LITHOTRIPSY (ESWL) (Left) as a surgical intervention .  The patient's history has been reviewed, patient examined, no change in status, stable for surgery.  I have reviewed the patient's chart and labs.  Questions were answered to the patient's satisfaction.     [name] I Semya Klinke

## 2016-06-16 DIAGNOSIS — Z5111 Encounter for antineoplastic chemotherapy: Secondary | ICD-10-CM | POA: Diagnosis not present

## 2016-06-16 DIAGNOSIS — C672 Malignant neoplasm of lateral wall of bladder: Secondary | ICD-10-CM | POA: Diagnosis not present

## 2016-06-24 DIAGNOSIS — N202 Calculus of kidney with calculus of ureter: Secondary | ICD-10-CM | POA: Diagnosis not present

## 2016-06-25 DIAGNOSIS — N202 Calculus of kidney with calculus of ureter: Secondary | ICD-10-CM | POA: Diagnosis not present

## 2016-06-25 DIAGNOSIS — N200 Calculus of kidney: Secondary | ICD-10-CM | POA: Diagnosis not present

## 2016-06-25 DIAGNOSIS — C672 Malignant neoplasm of lateral wall of bladder: Secondary | ICD-10-CM | POA: Diagnosis not present

## 2016-06-30 DIAGNOSIS — N200 Calculus of kidney: Secondary | ICD-10-CM | POA: Diagnosis not present

## 2016-07-06 DIAGNOSIS — Z23 Encounter for immunization: Secondary | ICD-10-CM | POA: Diagnosis not present

## 2016-07-09 DIAGNOSIS — N200 Calculus of kidney: Secondary | ICD-10-CM | POA: Diagnosis not present

## 2016-07-09 DIAGNOSIS — C672 Malignant neoplasm of lateral wall of bladder: Secondary | ICD-10-CM | POA: Diagnosis not present

## 2016-09-19 IMAGING — CR DG ABDOMEN 1V
1 series · 1 of 1 positions shown · non-contrast
Comparison: May 27, 2016

ADDENDUM:
There is also a 1 mm calculus in the lower pole of the right kidney.
CLINICAL DATA: Ureteral calculus, left side, preoperative

EXAM:
ABDOMEN - 1 VIEW

[t abdomen supine]
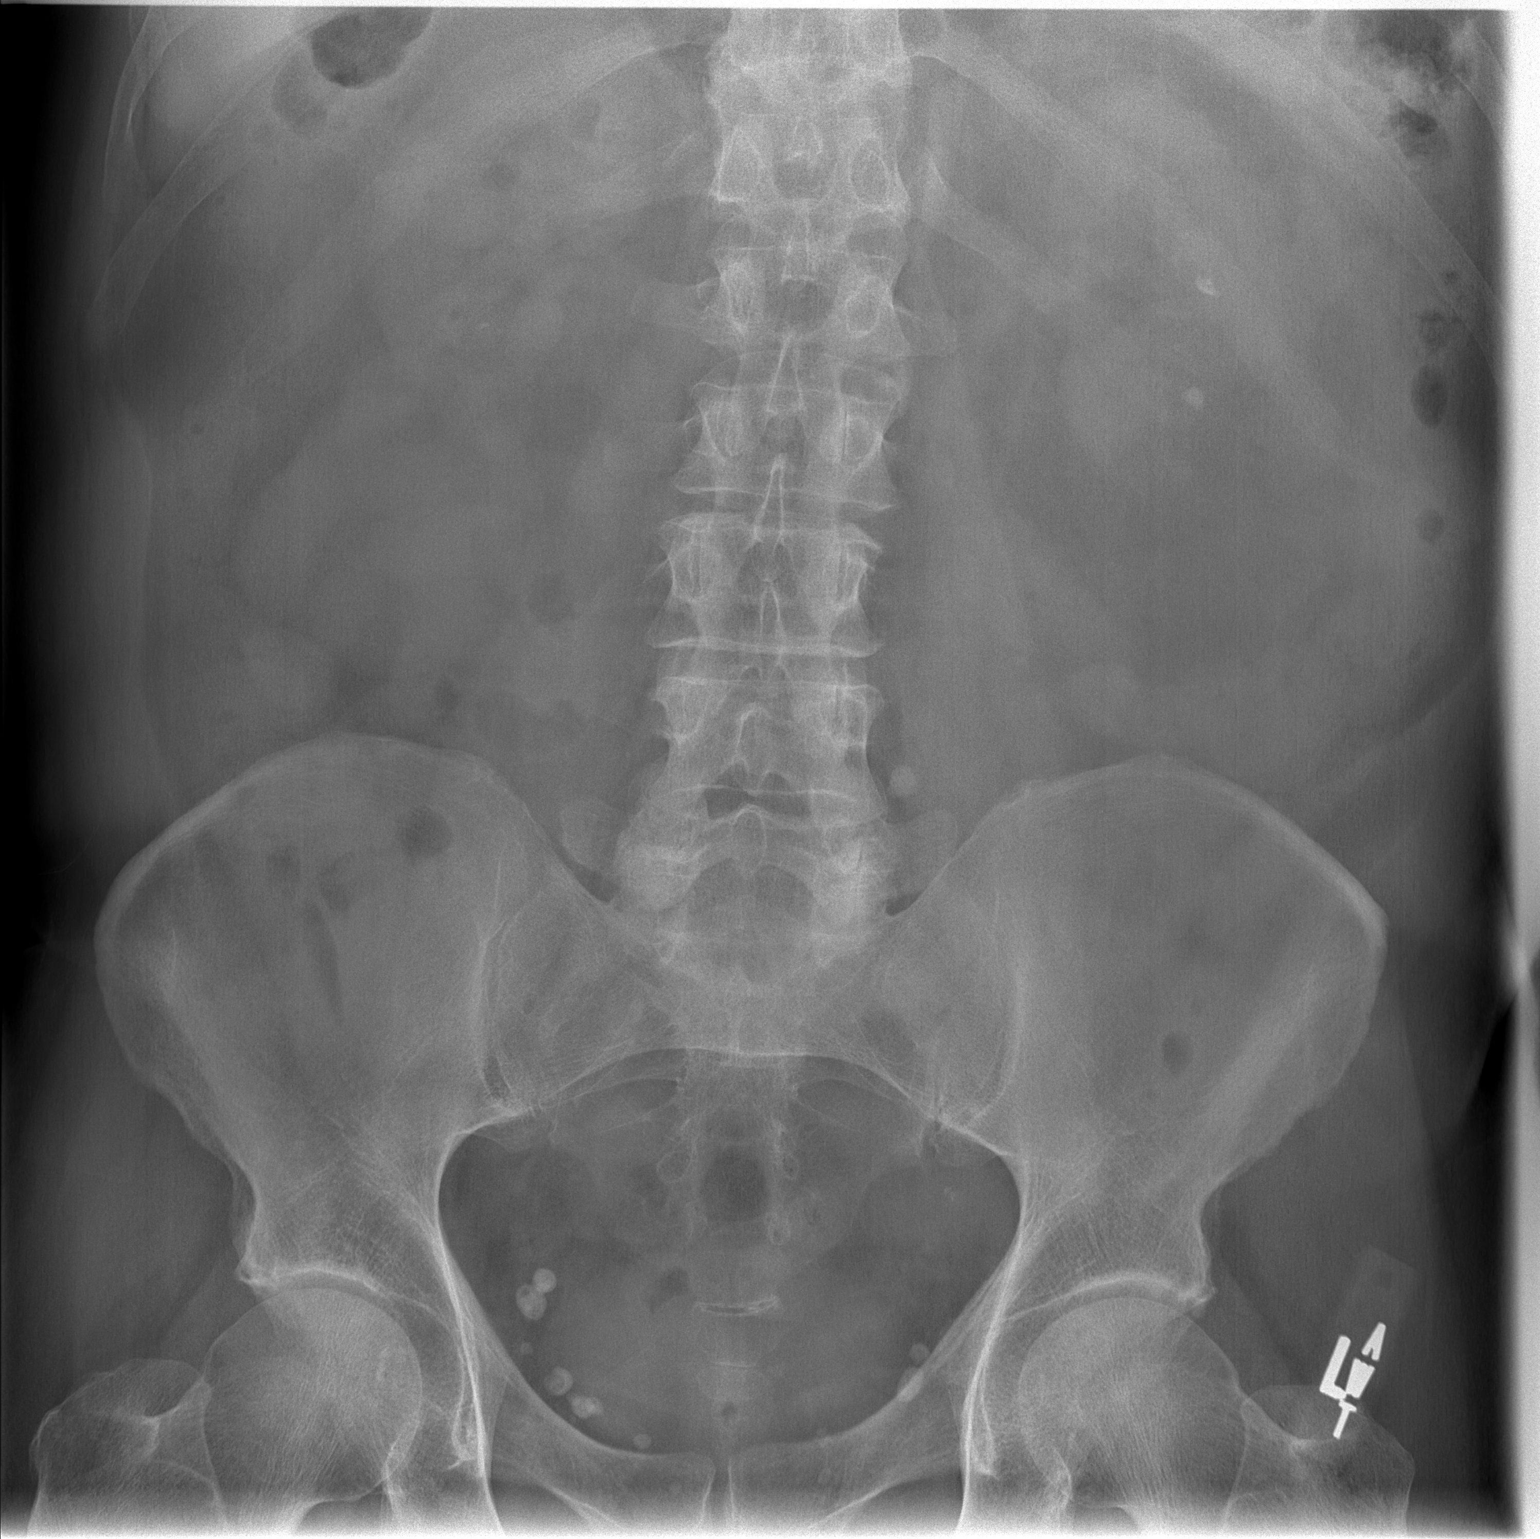

[1 of 1 positions shown; findings below may reference images not displayed]

FINDINGS: There is a left ureteral calculus measuring 8 x 7 mm, currently at
the level of the inferior aspect of the L4 vertebral body. There is
a 5 x 4 mm calculus in mid left kidney with a 5 x 4 mm calculus also
in the lower pole left kidney. Calcifications in the pelvis are
stable and felt to represent phleboliths. The bowel gas pattern is
unremarkable. No obstruction or free air evident.
IMPRESSION: Left ureteral calculus currently at the inferior aspect of the L4
vertebral body level. Stable intrarenal calculi on the left in the
mid portion and lower pole regions. Phleboliths in the pelvis noted.
Bowel gas pattern within normal limits.

## 2016-09-22 DIAGNOSIS — D1801 Hemangioma of skin and subcutaneous tissue: Secondary | ICD-10-CM | POA: Diagnosis not present

## 2016-09-22 DIAGNOSIS — L570 Actinic keratosis: Secondary | ICD-10-CM | POA: Diagnosis not present

## 2016-09-22 DIAGNOSIS — Z85828 Personal history of other malignant neoplasm of skin: Secondary | ICD-10-CM | POA: Diagnosis not present

## 2016-09-22 DIAGNOSIS — L821 Other seborrheic keratosis: Secondary | ICD-10-CM | POA: Diagnosis not present

## 2016-09-22 DIAGNOSIS — D225 Melanocytic nevi of trunk: Secondary | ICD-10-CM | POA: Diagnosis not present

## 2016-09-22 DIAGNOSIS — L853 Xerosis cutis: Secondary | ICD-10-CM | POA: Diagnosis not present

## 2016-09-29 DIAGNOSIS — K641 Second degree hemorrhoids: Secondary | ICD-10-CM | POA: Diagnosis not present

## 2016-09-29 DIAGNOSIS — K219 Gastro-esophageal reflux disease without esophagitis: Secondary | ICD-10-CM | POA: Diagnosis not present

## 2016-09-29 DIAGNOSIS — R151 Fecal smearing: Secondary | ICD-10-CM | POA: Diagnosis not present

## 2016-09-29 DIAGNOSIS — R21 Rash and other nonspecific skin eruption: Secondary | ICD-10-CM | POA: Diagnosis not present

## 2016-10-07 DIAGNOSIS — H5203 Hypermetropia, bilateral: Secondary | ICD-10-CM | POA: Diagnosis not present

## 2016-10-07 DIAGNOSIS — H2513 Age-related nuclear cataract, bilateral: Secondary | ICD-10-CM | POA: Diagnosis not present

## 2016-10-08 DIAGNOSIS — C672 Malignant neoplasm of lateral wall of bladder: Secondary | ICD-10-CM | POA: Diagnosis not present

## 2016-10-08 DIAGNOSIS — N200 Calculus of kidney: Secondary | ICD-10-CM | POA: Diagnosis not present

## 2016-10-21 DIAGNOSIS — C672 Malignant neoplasm of lateral wall of bladder: Secondary | ICD-10-CM | POA: Diagnosis not present

## 2016-10-23 ENCOUNTER — Other Ambulatory Visit: Payer: Self-pay | Admitting: Nurse Practitioner

## 2016-10-28 DIAGNOSIS — C672 Malignant neoplasm of lateral wall of bladder: Secondary | ICD-10-CM | POA: Diagnosis not present

## 2016-11-04 DIAGNOSIS — C672 Malignant neoplasm of lateral wall of bladder: Secondary | ICD-10-CM | POA: Diagnosis not present

## 2016-12-16 DIAGNOSIS — K219 Gastro-esophageal reflux disease without esophagitis: Secondary | ICD-10-CM | POA: Diagnosis not present

## 2016-12-16 DIAGNOSIS — Z87442 Personal history of urinary calculi: Secondary | ICD-10-CM | POA: Diagnosis not present

## 2016-12-16 DIAGNOSIS — Z8551 Personal history of malignant neoplasm of bladder: Secondary | ICD-10-CM | POA: Diagnosis not present

## 2016-12-16 DIAGNOSIS — L570 Actinic keratosis: Secondary | ICD-10-CM | POA: Diagnosis not present

## 2016-12-16 DIAGNOSIS — E7800 Pure hypercholesterolemia, unspecified: Secondary | ICD-10-CM | POA: Diagnosis not present

## 2016-12-16 DIAGNOSIS — R7301 Impaired fasting glucose: Secondary | ICD-10-CM | POA: Diagnosis not present

## 2016-12-16 DIAGNOSIS — Z79899 Other long term (current) drug therapy: Secondary | ICD-10-CM | POA: Diagnosis not present

## 2016-12-16 DIAGNOSIS — Z0000 Encounter for general adult medical examination without abnormal findings: Secondary | ICD-10-CM | POA: Diagnosis not present

## 2017-01-07 DIAGNOSIS — C672 Malignant neoplasm of lateral wall of bladder: Secondary | ICD-10-CM | POA: Diagnosis not present

## 2017-01-07 DIAGNOSIS — Z87442 Personal history of urinary calculi: Secondary | ICD-10-CM | POA: Diagnosis not present

## 2017-03-08 DIAGNOSIS — H1133 Conjunctival hemorrhage, bilateral: Secondary | ICD-10-CM | POA: Diagnosis not present

## 2017-04-11 DIAGNOSIS — C672 Malignant neoplasm of lateral wall of bladder: Secondary | ICD-10-CM | POA: Diagnosis not present

## 2017-04-11 DIAGNOSIS — N200 Calculus of kidney: Secondary | ICD-10-CM | POA: Diagnosis not present

## 2017-04-12 DIAGNOSIS — C672 Malignant neoplasm of lateral wall of bladder: Secondary | ICD-10-CM | POA: Diagnosis not present

## 2017-04-18 DIAGNOSIS — C672 Malignant neoplasm of lateral wall of bladder: Secondary | ICD-10-CM | POA: Diagnosis not present

## 2017-04-25 DIAGNOSIS — C672 Malignant neoplasm of lateral wall of bladder: Secondary | ICD-10-CM | POA: Diagnosis not present

## 2017-06-29 DIAGNOSIS — M79645 Pain in left finger(s): Secondary | ICD-10-CM | POA: Diagnosis not present

## 2017-06-29 DIAGNOSIS — Z6827 Body mass index (BMI) 27.0-27.9, adult: Secondary | ICD-10-CM | POA: Diagnosis not present

## 2017-06-29 DIAGNOSIS — Z23 Encounter for immunization: Secondary | ICD-10-CM | POA: Diagnosis not present

## 2017-06-29 DIAGNOSIS — E663 Overweight: Secondary | ICD-10-CM | POA: Diagnosis not present

## 2017-07-07 DIAGNOSIS — S61411A Laceration without foreign body of right hand, initial encounter: Secondary | ICD-10-CM | POA: Diagnosis not present

## 2017-07-18 DIAGNOSIS — Z87442 Personal history of urinary calculi: Secondary | ICD-10-CM | POA: Diagnosis not present

## 2017-07-18 DIAGNOSIS — C672 Malignant neoplasm of lateral wall of bladder: Secondary | ICD-10-CM | POA: Diagnosis not present

## 2017-07-18 DIAGNOSIS — R82994 Hypercalciuria: Secondary | ICD-10-CM | POA: Diagnosis not present

## 2017-07-21 DIAGNOSIS — Z4802 Encounter for removal of sutures: Secondary | ICD-10-CM | POA: Diagnosis not present

## 2017-08-22 DIAGNOSIS — R350 Frequency of micturition: Secondary | ICD-10-CM | POA: Diagnosis not present

## 2017-09-05 DIAGNOSIS — N402 Nodular prostate without lower urinary tract symptoms: Secondary | ICD-10-CM | POA: Diagnosis not present

## 2017-09-05 DIAGNOSIS — J209 Acute bronchitis, unspecified: Secondary | ICD-10-CM | POA: Diagnosis not present

## 2017-10-19 DIAGNOSIS — L814 Other melanin hyperpigmentation: Secondary | ICD-10-CM | POA: Diagnosis not present

## 2017-10-19 DIAGNOSIS — Z85828 Personal history of other malignant neoplasm of skin: Secondary | ICD-10-CM | POA: Diagnosis not present

## 2017-10-19 DIAGNOSIS — L821 Other seborrheic keratosis: Secondary | ICD-10-CM | POA: Diagnosis not present

## 2017-10-19 DIAGNOSIS — D225 Melanocytic nevi of trunk: Secondary | ICD-10-CM | POA: Diagnosis not present

## 2017-10-19 DIAGNOSIS — D1801 Hemangioma of skin and subcutaneous tissue: Secondary | ICD-10-CM | POA: Diagnosis not present

## 2017-10-21 DIAGNOSIS — R82994 Hypercalciuria: Secondary | ICD-10-CM | POA: Diagnosis not present

## 2017-10-21 DIAGNOSIS — N200 Calculus of kidney: Secondary | ICD-10-CM | POA: Diagnosis not present

## 2017-10-21 DIAGNOSIS — Z8551 Personal history of malignant neoplasm of bladder: Secondary | ICD-10-CM | POA: Diagnosis not present

## 2017-10-21 DIAGNOSIS — C672 Malignant neoplasm of lateral wall of bladder: Secondary | ICD-10-CM | POA: Diagnosis not present

## 2017-10-21 DIAGNOSIS — N138 Other obstructive and reflux uropathy: Secondary | ICD-10-CM | POA: Diagnosis not present

## 2017-10-21 DIAGNOSIS — N401 Enlarged prostate with lower urinary tract symptoms: Secondary | ICD-10-CM | POA: Diagnosis not present

## 2017-10-27 DIAGNOSIS — C679 Malignant neoplasm of bladder, unspecified: Secondary | ICD-10-CM | POA: Diagnosis not present

## 2017-11-09 DIAGNOSIS — C672 Malignant neoplasm of lateral wall of bladder: Secondary | ICD-10-CM | POA: Diagnosis not present

## 2017-11-10 DIAGNOSIS — C672 Malignant neoplasm of lateral wall of bladder: Secondary | ICD-10-CM | POA: Diagnosis not present

## 2017-11-14 DIAGNOSIS — N202 Calculus of kidney with calculus of ureter: Secondary | ICD-10-CM | POA: Diagnosis not present

## 2017-11-14 DIAGNOSIS — C672 Malignant neoplasm of lateral wall of bladder: Secondary | ICD-10-CM | POA: Diagnosis not present

## 2017-11-14 DIAGNOSIS — I2510 Atherosclerotic heart disease of native coronary artery without angina pectoris: Secondary | ICD-10-CM | POA: Diagnosis not present

## 2017-11-14 DIAGNOSIS — Z9049 Acquired absence of other specified parts of digestive tract: Secondary | ICD-10-CM | POA: Diagnosis not present

## 2017-11-14 DIAGNOSIS — N281 Cyst of kidney, acquired: Secondary | ICD-10-CM | POA: Diagnosis not present

## 2017-11-14 DIAGNOSIS — K7689 Other specified diseases of liver: Secondary | ICD-10-CM | POA: Diagnosis not present

## 2017-11-14 DIAGNOSIS — Q2546 Tortuous aortic arch: Secondary | ICD-10-CM | POA: Diagnosis not present

## 2017-11-14 DIAGNOSIS — M47815 Spondylosis without myelopathy or radiculopathy, thoracolumbar region: Secondary | ICD-10-CM | POA: Diagnosis not present

## 2017-11-14 DIAGNOSIS — M159 Polyosteoarthritis, unspecified: Secondary | ICD-10-CM | POA: Diagnosis not present

## 2017-11-14 DIAGNOSIS — K5730 Diverticulosis of large intestine without perforation or abscess without bleeding: Secondary | ICD-10-CM | POA: Diagnosis not present

## 2017-11-14 DIAGNOSIS — N329 Bladder disorder, unspecified: Secondary | ICD-10-CM | POA: Diagnosis not present

## 2017-11-14 DIAGNOSIS — I700 Atherosclerosis of aorta: Secondary | ICD-10-CM | POA: Diagnosis not present

## 2017-11-17 DIAGNOSIS — C672 Malignant neoplasm of lateral wall of bladder: Secondary | ICD-10-CM | POA: Diagnosis not present

## 2017-11-28 DIAGNOSIS — N200 Calculus of kidney: Secondary | ICD-10-CM | POA: Diagnosis not present

## 2017-12-29 DIAGNOSIS — N402 Nodular prostate without lower urinary tract symptoms: Secondary | ICD-10-CM | POA: Diagnosis not present

## 2017-12-29 DIAGNOSIS — Z8551 Personal history of malignant neoplasm of bladder: Secondary | ICD-10-CM | POA: Diagnosis not present

## 2017-12-29 DIAGNOSIS — Z87442 Personal history of urinary calculi: Secondary | ICD-10-CM | POA: Diagnosis not present

## 2017-12-29 DIAGNOSIS — Z79899 Other long term (current) drug therapy: Secondary | ICD-10-CM | POA: Diagnosis not present

## 2017-12-29 DIAGNOSIS — E7800 Pure hypercholesterolemia, unspecified: Secondary | ICD-10-CM | POA: Diagnosis not present

## 2017-12-29 DIAGNOSIS — Z0000 Encounter for general adult medical examination without abnormal findings: Secondary | ICD-10-CM | POA: Diagnosis not present

## 2017-12-29 DIAGNOSIS — R7301 Impaired fasting glucose: Secondary | ICD-10-CM | POA: Diagnosis not present

## 2017-12-29 DIAGNOSIS — L570 Actinic keratosis: Secondary | ICD-10-CM | POA: Diagnosis not present

## 2018-04-28 DIAGNOSIS — R82994 Hypercalciuria: Secondary | ICD-10-CM | POA: Diagnosis not present

## 2018-04-28 DIAGNOSIS — N401 Enlarged prostate with lower urinary tract symptoms: Secondary | ICD-10-CM | POA: Diagnosis not present

## 2018-04-28 DIAGNOSIS — C672 Malignant neoplasm of lateral wall of bladder: Secondary | ICD-10-CM | POA: Diagnosis not present

## 2018-04-28 DIAGNOSIS — N200 Calculus of kidney: Secondary | ICD-10-CM | POA: Diagnosis not present

## 2018-04-28 DIAGNOSIS — N138 Other obstructive and reflux uropathy: Secondary | ICD-10-CM | POA: Diagnosis not present

## 2018-06-06 DIAGNOSIS — Z23 Encounter for immunization: Secondary | ICD-10-CM | POA: Diagnosis not present

## 2018-06-08 DIAGNOSIS — H2513 Age-related nuclear cataract, bilateral: Secondary | ICD-10-CM | POA: Diagnosis not present

## 2018-10-30 DIAGNOSIS — N200 Calculus of kidney: Secondary | ICD-10-CM | POA: Diagnosis not present

## 2018-10-30 DIAGNOSIS — N401 Enlarged prostate with lower urinary tract symptoms: Secondary | ICD-10-CM | POA: Diagnosis not present

## 2018-10-30 DIAGNOSIS — Z08 Encounter for follow-up examination after completed treatment for malignant neoplasm: Secondary | ICD-10-CM | POA: Diagnosis not present

## 2018-10-30 DIAGNOSIS — N138 Other obstructive and reflux uropathy: Secondary | ICD-10-CM | POA: Diagnosis not present

## 2018-10-30 DIAGNOSIS — Z85528 Personal history of other malignant neoplasm of kidney: Secondary | ICD-10-CM | POA: Diagnosis not present

## 2018-10-30 DIAGNOSIS — R82994 Hypercalciuria: Secondary | ICD-10-CM | POA: Diagnosis not present

## 2019-03-21 DIAGNOSIS — Z0000 Encounter for general adult medical examination without abnormal findings: Secondary | ICD-10-CM | POA: Diagnosis not present

## 2019-03-21 DIAGNOSIS — L570 Actinic keratosis: Secondary | ICD-10-CM | POA: Diagnosis not present

## 2019-03-21 DIAGNOSIS — Z79899 Other long term (current) drug therapy: Secondary | ICD-10-CM | POA: Diagnosis not present

## 2019-03-21 DIAGNOSIS — Z87442 Personal history of urinary calculi: Secondary | ICD-10-CM | POA: Diagnosis not present

## 2019-03-21 DIAGNOSIS — Z8551 Personal history of malignant neoplasm of bladder: Secondary | ICD-10-CM | POA: Diagnosis not present

## 2019-03-21 DIAGNOSIS — Z1159 Encounter for screening for other viral diseases: Secondary | ICD-10-CM | POA: Diagnosis not present

## 2019-03-21 DIAGNOSIS — R7301 Impaired fasting glucose: Secondary | ICD-10-CM | POA: Diagnosis not present

## 2019-03-21 DIAGNOSIS — N402 Nodular prostate without lower urinary tract symptoms: Secondary | ICD-10-CM | POA: Diagnosis not present

## 2019-03-21 DIAGNOSIS — E7800 Pure hypercholesterolemia, unspecified: Secondary | ICD-10-CM | POA: Diagnosis not present

## 2019-04-10 DIAGNOSIS — L738 Other specified follicular disorders: Secondary | ICD-10-CM | POA: Diagnosis not present

## 2019-04-10 DIAGNOSIS — L821 Other seborrheic keratosis: Secondary | ICD-10-CM | POA: Diagnosis not present

## 2019-04-10 DIAGNOSIS — L570 Actinic keratosis: Secondary | ICD-10-CM | POA: Diagnosis not present

## 2019-04-10 DIAGNOSIS — D1801 Hemangioma of skin and subcutaneous tissue: Secondary | ICD-10-CM | POA: Diagnosis not present

## 2019-04-10 DIAGNOSIS — L820 Inflamed seborrheic keratosis: Secondary | ICD-10-CM | POA: Diagnosis not present

## 2019-04-10 DIAGNOSIS — L812 Freckles: Secondary | ICD-10-CM | POA: Diagnosis not present

## 2019-05-11 DIAGNOSIS — R82994 Hypercalciuria: Secondary | ICD-10-CM | POA: Diagnosis not present

## 2019-05-11 DIAGNOSIS — N138 Other obstructive and reflux uropathy: Secondary | ICD-10-CM | POA: Diagnosis not present

## 2019-05-11 DIAGNOSIS — N401 Enlarged prostate with lower urinary tract symptoms: Secondary | ICD-10-CM | POA: Diagnosis not present

## 2019-05-11 DIAGNOSIS — N200 Calculus of kidney: Secondary | ICD-10-CM | POA: Diagnosis not present

## 2019-05-11 DIAGNOSIS — Z8551 Personal history of malignant neoplasm of bladder: Secondary | ICD-10-CM | POA: Diagnosis not present

## 2019-05-17 DIAGNOSIS — N200 Calculus of kidney: Secondary | ICD-10-CM | POA: Diagnosis not present

## 2019-05-17 DIAGNOSIS — N281 Cyst of kidney, acquired: Secondary | ICD-10-CM | POA: Diagnosis not present

## 2019-06-22 DIAGNOSIS — Z23 Encounter for immunization: Secondary | ICD-10-CM | POA: Diagnosis not present

## 2019-07-13 DIAGNOSIS — E7800 Pure hypercholesterolemia, unspecified: Secondary | ICD-10-CM | POA: Diagnosis not present

## 2019-07-13 DIAGNOSIS — Z8551 Personal history of malignant neoplasm of bladder: Secondary | ICD-10-CM | POA: Diagnosis not present

## 2019-07-13 DIAGNOSIS — R7301 Impaired fasting glucose: Secondary | ICD-10-CM | POA: Diagnosis not present

## 2019-07-13 DIAGNOSIS — Z1159 Encounter for screening for other viral diseases: Secondary | ICD-10-CM | POA: Diagnosis not present

## 2019-07-13 DIAGNOSIS — Z87442 Personal history of urinary calculi: Secondary | ICD-10-CM | POA: Diagnosis not present

## 2019-07-13 DIAGNOSIS — L570 Actinic keratosis: Secondary | ICD-10-CM | POA: Diagnosis not present

## 2019-07-13 DIAGNOSIS — Z79899 Other long term (current) drug therapy: Secondary | ICD-10-CM | POA: Diagnosis not present

## 2019-07-13 DIAGNOSIS — N402 Nodular prostate without lower urinary tract symptoms: Secondary | ICD-10-CM | POA: Diagnosis not present

## 2019-07-13 DIAGNOSIS — Z0000 Encounter for general adult medical examination without abnormal findings: Secondary | ICD-10-CM | POA: Diagnosis not present

## 2019-09-13 DIAGNOSIS — R55 Syncope and collapse: Secondary | ICD-10-CM | POA: Diagnosis not present

## 2019-09-13 DIAGNOSIS — S299XXA Unspecified injury of thorax, initial encounter: Secondary | ICD-10-CM | POA: Diagnosis not present

## 2019-09-14 DIAGNOSIS — R55 Syncope and collapse: Secondary | ICD-10-CM | POA: Diagnosis not present

## 2019-09-14 DIAGNOSIS — S299XXA Unspecified injury of thorax, initial encounter: Secondary | ICD-10-CM | POA: Diagnosis not present

## 2019-09-19 DIAGNOSIS — E876 Hypokalemia: Secondary | ICD-10-CM | POA: Diagnosis not present

## 2019-09-19 DIAGNOSIS — R55 Syncope and collapse: Secondary | ICD-10-CM | POA: Diagnosis not present

## 2019-11-15 ENCOUNTER — Ambulatory Visit: Payer: Medicare Other

## 2019-11-19 DIAGNOSIS — N401 Enlarged prostate with lower urinary tract symptoms: Secondary | ICD-10-CM | POA: Diagnosis not present

## 2019-11-19 DIAGNOSIS — Z87442 Personal history of urinary calculi: Secondary | ICD-10-CM | POA: Diagnosis not present

## 2019-11-19 DIAGNOSIS — Z8551 Personal history of malignant neoplasm of bladder: Secondary | ICD-10-CM | POA: Diagnosis not present

## 2019-11-19 DIAGNOSIS — N138 Other obstructive and reflux uropathy: Secondary | ICD-10-CM | POA: Diagnosis not present

## 2019-11-24 ENCOUNTER — Ambulatory Visit: Payer: Medicare Other

## 2019-12-05 DIAGNOSIS — H2513 Age-related nuclear cataract, bilateral: Secondary | ICD-10-CM | POA: Diagnosis not present

## 2019-12-06 ENCOUNTER — Ambulatory Visit: Payer: Medicare Other

## 2020-03-24 DIAGNOSIS — Z20822 Contact with and (suspected) exposure to covid-19: Secondary | ICD-10-CM | POA: Diagnosis not present

## 2020-04-02 DIAGNOSIS — Z8551 Personal history of malignant neoplasm of bladder: Secondary | ICD-10-CM | POA: Diagnosis not present

## 2020-04-02 DIAGNOSIS — Z87442 Personal history of urinary calculi: Secondary | ICD-10-CM | POA: Diagnosis not present

## 2020-04-02 DIAGNOSIS — N400 Enlarged prostate without lower urinary tract symptoms: Secondary | ICD-10-CM | POA: Diagnosis not present

## 2020-04-02 DIAGNOSIS — L570 Actinic keratosis: Secondary | ICD-10-CM | POA: Diagnosis not present

## 2020-04-02 DIAGNOSIS — G4700 Insomnia, unspecified: Secondary | ICD-10-CM | POA: Diagnosis not present

## 2020-04-02 DIAGNOSIS — Z0000 Encounter for general adult medical examination without abnormal findings: Secondary | ICD-10-CM | POA: Diagnosis not present

## 2020-04-02 DIAGNOSIS — Z79899 Other long term (current) drug therapy: Secondary | ICD-10-CM | POA: Diagnosis not present

## 2020-04-02 DIAGNOSIS — R7301 Impaired fasting glucose: Secondary | ICD-10-CM | POA: Diagnosis not present

## 2020-04-02 DIAGNOSIS — E7800 Pure hypercholesterolemia, unspecified: Secondary | ICD-10-CM | POA: Diagnosis not present

## 2020-04-02 DIAGNOSIS — R001 Bradycardia, unspecified: Secondary | ICD-10-CM | POA: Diagnosis not present

## 2020-05-19 DIAGNOSIS — N200 Calculus of kidney: Secondary | ICD-10-CM | POA: Diagnosis not present

## 2020-05-19 DIAGNOSIS — Z8551 Personal history of malignant neoplasm of bladder: Secondary | ICD-10-CM | POA: Diagnosis not present

## 2020-05-19 DIAGNOSIS — N138 Other obstructive and reflux uropathy: Secondary | ICD-10-CM | POA: Diagnosis not present

## 2020-05-19 DIAGNOSIS — N401 Enlarged prostate with lower urinary tract symptoms: Secondary | ICD-10-CM | POA: Diagnosis not present

## 2020-05-19 DIAGNOSIS — R82994 Hypercalciuria: Secondary | ICD-10-CM | POA: Diagnosis not present

## 2020-05-29 DIAGNOSIS — L821 Other seborrheic keratosis: Secondary | ICD-10-CM | POA: Diagnosis not present

## 2020-05-29 DIAGNOSIS — L812 Freckles: Secondary | ICD-10-CM | POA: Diagnosis not present

## 2020-05-29 DIAGNOSIS — L570 Actinic keratosis: Secondary | ICD-10-CM | POA: Diagnosis not present

## 2020-05-29 DIAGNOSIS — D1801 Hemangioma of skin and subcutaneous tissue: Secondary | ICD-10-CM | POA: Diagnosis not present

## 2020-06-26 DIAGNOSIS — Z23 Encounter for immunization: Secondary | ICD-10-CM | POA: Diagnosis not present

## 2020-08-26 DIAGNOSIS — Z23 Encounter for immunization: Secondary | ICD-10-CM | POA: Diagnosis not present

## 2020-12-04 DIAGNOSIS — R82994 Hypercalciuria: Secondary | ICD-10-CM | POA: Diagnosis not present

## 2020-12-04 DIAGNOSIS — Z8551 Personal history of malignant neoplasm of bladder: Secondary | ICD-10-CM | POA: Diagnosis not present

## 2020-12-04 DIAGNOSIS — N400 Enlarged prostate without lower urinary tract symptoms: Secondary | ICD-10-CM | POA: Diagnosis not present

## 2020-12-04 DIAGNOSIS — N200 Calculus of kidney: Secondary | ICD-10-CM | POA: Diagnosis not present

## 2021-02-11 ENCOUNTER — Telehealth: Payer: Self-pay | Admitting: Family Medicine

## 2021-02-11 NOTE — Telephone Encounter (Signed)
Patient is scheduled   

## 2021-02-11 NOTE — Telephone Encounter (Signed)
The patient would like to establish care with you. His daughter is Domenic Polite and her husband is Benard Rink.  Can I schedule this patient?

## 2021-04-13 ENCOUNTER — Encounter: Payer: Self-pay | Admitting: Family Medicine

## 2021-04-13 ENCOUNTER — Other Ambulatory Visit: Payer: Self-pay

## 2021-04-13 ENCOUNTER — Ambulatory Visit (INDEPENDENT_AMBULATORY_CARE_PROVIDER_SITE_OTHER): Payer: Medicare Other | Admitting: Family Medicine

## 2021-04-13 VITALS — BP 120/70 | HR 77 | Temp 97.8°F | Ht 70.0 in | Wt 177.5 lb

## 2021-04-13 DIAGNOSIS — Z87442 Personal history of urinary calculi: Secondary | ICD-10-CM | POA: Insufficient documentation

## 2021-04-13 DIAGNOSIS — K219 Gastro-esophageal reflux disease without esophagitis: Secondary | ICD-10-CM | POA: Diagnosis not present

## 2021-04-13 DIAGNOSIS — C672 Malignant neoplasm of lateral wall of bladder: Secondary | ICD-10-CM | POA: Diagnosis not present

## 2021-04-13 DIAGNOSIS — E785 Hyperlipidemia, unspecified: Secondary | ICD-10-CM | POA: Insufficient documentation

## 2021-04-13 LAB — BASIC METABOLIC PANEL
BUN: 16 mg/dL (ref 6–23)
CO2: 31 mEq/L (ref 19–32)
Calcium: 10.8 mg/dL — ABNORMAL HIGH (ref 8.4–10.5)
Chloride: 100 mEq/L (ref 96–112)
Creatinine, Ser: 0.89 mg/dL (ref 0.40–1.50)
GFR: 83.35 mL/min (ref >60.00)
Glucose, Bld: 97 mg/dL (ref 70–99)
Potassium: 4 mEq/L (ref 3.5–5.1)
Sodium: 138 mEq/L (ref 135–145)

## 2021-04-13 LAB — HEPATIC FUNCTION PANEL
ALT: 32 U/L (ref 0–53)
AST: 23 U/L (ref 0–37)
Albumin: 4.4 g/dL (ref 3.5–5.2)
Alkaline Phosphatase: 50 U/L (ref 39–117)
Bilirubin, Direct: 0.1 mg/dL (ref 0.0–0.3)
Total Bilirubin: 0.7 mg/dL (ref 0.2–1.2)
Total Protein: 6.6 g/dL (ref 6.0–8.3)

## 2021-04-13 LAB — LIPID PANEL
Cholesterol: 202 mg/dL — ABNORMAL HIGH (ref 0–200)
HDL: 58.1 mg/dL (ref >39.00)
LDL Cholesterol: 111 mg/dL — ABNORMAL HIGH (ref 0–99)
NonHDL: 143.63
Total CHOL/HDL Ratio: 3
Triglycerides: 162 mg/dL — ABNORMAL HIGH (ref 0.0–149.0)
VLDL: 32.4 mg/dL (ref 0.0–40.0)

## 2021-04-13 NOTE — Progress Notes (Signed)
New Patient Office Visit  Subjective:  Patient ID: Jeffrey Horn, male    DOB: [birth year]  Age: 75 y.o. MRN: [id number]  CC:  Chief Complaint  Patient presents with   New Patient (Initial Visit)    No new concerns     HPI Jeffrey Horn presents for establishing care.  His previous primary care provider is no longer practicing.  He has history of bladder cancer and is still followed by urology.  Has also had multiple kidney stones previously.  He has hyperlipidemia treated with lovastatin.  He is on indapamide per urology because of his stone history.  History of GERD which is currently controlled without regular medications.  Multiple surgeries as outlined below.  Social history-married for 53 years.  He has 3 daughters.  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  Ran up until age 40.  Enjoys target practice, reading, yard work.  Non-smoker.  Usually drinks couple beers per day.  Family history-Father died from lung cancer complications a 7.  Both his parents had history of alcohol abuse.  His mom died age 29 related to head injury from fall.  No siblings except for one half brother  Past Medical History:  Diagnosis Date   Allergy    BPH (benign prostatic hypertrophy)    Chicken pox    GERD (gastroesophageal reflux disease)    History of basal cell carcinoma excision    elbow   History of kidney stones    History of skin cancer    EXCISION MALIGNANT NEOPLASM   Hyperlipidemia    Hypogonadism male    Nocturia    Prostate nodule    Recurrent bladder papillary carcinoma ([place]) MONTIORED BY DR Gaynelle Arabian and oncologist-  dr Alen Blew   First dx Dec 2014--  S/P  TURBT's  and BCG and Mitomyocin C tx's   Renal calculus, bilateral    Renal cyst, acquired     Past Surgical History:  Procedure Laterality Date   CYSTOSCOPY W/ RETROGRADES Bilateral 10/30/2015   Procedure: CYSTOSCOPY WITH RETROGRADE PYELOGRAM;   Surgeon: Carolan Clines, MD;  Location: [place];  Service: Urology;  Laterality: Bilateral;   HEMORRHOID BANDING     MD OFFICE   LAPAROSCOPIC CHOLECYSTECTOMY  02/24/2009   LASIK Bilateral 06/18/1989   TONSILLECTOMY  as child   TONSILLECTOMY  2017   TRANSURETHRAL RESECTION OF BLADDER TUMOR N/A 12/03/2013   Procedure: TRANSURETHRAL RESECTION OF BLADDER TUMOR (TURBT)AT BLADDER NECK 8CM, TRANSURETHRAL RESECTION OF PROSTATE;  Surgeon: Ailene Rud, MD;  Location: [facility];  Service: Urology;  Laterality: N/A;   TRANSURETHRAL RESECTION OF BLADDER TUMOR N/A 11/01/2014   Procedure: TRANSURETHRAL RESECTION OF BLADDER TUMOR (TURBT);  Surgeon: Ailene Rud, MD;  Location: [facility];  Service: Urology;  Laterality: N/A;   TRANSURETHRAL RESECTION OF BLADDER TUMOR N/A 10/30/2015   Procedure: TRANSURETHRAL RESECTION OF BLADDER TUMOR X2 (TURBT) WITH TAUBER FORCEPS;  Surgeon: Carolan Clines, MD;  Location: [place];  Service: Urology;  Laterality: N/A;   TRANSURETHRAL RESECTION OF BLADDER TUMOR WITH GYRUS (TURBT-GYRUS) N/A 07/12/2014   Procedure: TRANSURETHRAL RESECTION OF BLADDER TUMOR WITH GYRUS (TURBT-GYRUS);  Surgeon: Ailene Rud, MD;  Location: [facility];  Service: Urology;  Laterality: N/A;   WISDOM TEETH EXTRACTED  10/19/2003   oral surgeon office- bp dropped- but recovered    Family History  Problem Relation Age of Onset  Alcohol abuse Mother    Cancer Father 77       lung cancer   Alcohol abuse Father    Hyperlipidemia Daughter     Social History   Socioeconomic History   Marital status: Married    Spouse name: Not on file   Number of children: Not on file   Years of education: Not on file   Highest education level: Not on file  Occupational History   Not on file  Tobacco Use   Smoking status: Never   Smokeless tobacco: Never  Substance and Sexual Activity   Alcohol use: Yes     Alcohol/week: 14.0 standard drinks    Types: 14 Cans of beer per week    Comment: COUPLE  BEERS A DAY   Drug use: No   Sexual activity: Not on file  Other Topics Concern   Not on file  Social History Narrative   Not on file   Social Determinants of Health   Financial Resource Strain: Not on file  Food Insecurity: Not on file  Transportation Needs: Not on file  Physical Activity: Not on file  Stress: Not on file  Social Connections: Not on file  Intimate Partner Violence: Not on file    ROS Review of Systems  Constitutional:  Negative for fatigue.  Eyes:  Negative for visual disturbance.  Respiratory:  Negative for cough, chest tightness and shortness of breath.   Cardiovascular:  Negative for chest pain, palpitations and leg swelling.  Neurological:  Negative for dizziness, syncope, weakness, light-headedness and headaches.   Objective:   Today's Vitals: BP 120/70 (BP Location: Left Arm, Patient Position: Sitting, Cuff Size: Normal)   Pulse 77   Temp 97.8 F (36.6 C) (Oral)   Ht 5\' 10"  (1.778 m)   Wt 177 lb 8 oz (80.5 kg)   SpO2 96%   BMI 25.47 kg/m   Physical Exam Constitutional:      Appearance: He is well-developed.  HENT:     Right Ear: External ear normal.     Left Ear: External ear normal.  Eyes:     Pupils: Pupils are equal, round, and reactive to light.  Neck:     Thyroid: No thyromegaly.  Cardiovascular:     Rate and Rhythm: Normal rate and regular rhythm.  Pulmonary:     Effort: Pulmonary effort is normal. No respiratory distress.     Breath sounds: Normal breath sounds. No wheezing or rales.  Musculoskeletal:     Cervical back: Neck supple.     Right lower leg: No edema.     Left lower leg: No edema.  Neurological:     Mental Status: He is alert and oriented to person, place, and time.    Assessment & Plan:   #1 history of bladder cancer followed by urology  #2 history of recurrent kidney stones.  Patient currently on chronic indapamide  therapy  #3 history of GERD controlled with lifestyle modification  #4 dyslipidemia treated with lovastatin 20 mg daily  -Recheck lipid and hepatic panel   Outpatient Encounter Medications as of 04/13/2021  Medication Sig   indapamide (LOZOL) 2.5 MG tablet Take 2.5 mg by mouth daily.   loratadine (CLARITIN) 10 MG tablet Take 10 mg by mouth daily.   lovastatin (MEVACOR) 20 MG tablet Take 20 mg by mouth every evening.   Multiple Vitamin (MULTIVITAMIN) tablet Take 1 tablet by mouth daily.   [DISCONTINUED] Methen-Hyosc-Meth Blue-Na Phos (UROGESIC-BLUE) 81.6 MG TABS Take 1 tablet 2-4  x/day as needed for urinary spasm or  burning   [DISCONTINUED] trimethoprim (TRIMPEX) 100 MG tablet Take 1 tablet (100 mg total) by mouth 1 day or 1 dose.   No facility-administered encounter medications on file as of 04/13/2021.    Follow-up: No follow-ups on file.   Carolann Littler, MD

## 2021-05-07 DIAGNOSIS — Z23 Encounter for immunization: Secondary | ICD-10-CM | POA: Diagnosis not present

## 2021-05-14 ENCOUNTER — Other Ambulatory Visit: Payer: Self-pay

## 2021-05-14 ENCOUNTER — Ambulatory Visit (INDEPENDENT_AMBULATORY_CARE_PROVIDER_SITE_OTHER): Payer: Medicare Other

## 2021-05-14 VITALS — BP 110/78 | HR 66 | Temp 98.4°F | Ht 70.0 in | Wt 175.0 lb

## 2021-05-14 DIAGNOSIS — Z0000 Encounter for general adult medical examination without abnormal findings: Secondary | ICD-10-CM

## 2021-05-14 NOTE — Patient Instructions (Signed)
Jeffrey Horn , Thank you for taking time to come for your Medicare Wellness Visit. I appreciate your ongoing commitment to your health goals. Please review the following plan we discussed and let me know if I can assist you in the future.   Screening recommendations/referrals: Colonoscopy: no longer required  Recommended yearly ophthalmology/optometry visit for glaucoma screening and checkup Recommended yearly dental visit for hygiene and checkup  Vaccinations: Influenza vaccine: due in fall 2022  Pneumococcal vaccine: completed series  Tdap vaccine: due upon injury  Shingles vaccine: completed series     Advanced directives: will provide copies   Conditions/risks identified: none   Next appointment: none   Preventive Care 35 Years and Older, Male Preventive care refers to lifestyle choices and visits with your health care provider that can promote health and wellness. What does preventive care include? A yearly physical exam. This is also called an annual well check. Dental exams once or twice a year. Routine eye exams. Ask your health care provider how often you should have your eyes checked. Personal lifestyle choices, including: Daily care of your teeth and gums. Regular physical activity. Eating a healthy diet. Avoiding tobacco and drug use. Limiting alcohol use. Practicing safe sex. Taking low doses of aspirin every day. Taking vitamin and mineral supplements as recommended by your health care provider. What happens during an annual well check? The services and screenings done by your health care provider during your annual well check will depend on your age, overall health, lifestyle risk factors, and family history of disease. Counseling  Your health care provider may ask you questions about your: Alcohol use. Tobacco use. Drug use. Emotional well-being. Home and relationship well-being. Sexual activity. Eating habits. History of falls. Memory and ability to  understand (cognition). Work and work Statistician. Screening  You may have the following tests or measurements: Height, weight, and BMI. Blood pressure. Lipid and cholesterol levels. These may be checked every 5 years, or more frequently if you are over 26 years old. Skin check. Lung cancer screening. You may have this screening every year starting at age 69 if you have a 30-pack-year history of smoking and currently smoke or have quit within the past 15 years. Fecal occult blood test (FOBT) of the stool. You may have this test every year starting at age 24. Flexible sigmoidoscopy or colonoscopy. You may have a sigmoidoscopy every 5 years or a colonoscopy every 10 years starting at age 90. Prostate cancer screening. Recommendations will vary depending on your family history and other risks. Hepatitis C blood test. Hepatitis B blood test. Sexually transmitted disease (STD) testing. Diabetes screening. This is done by checking your blood sugar (glucose) after you have not eaten for a while (fasting). You may have this done every 1-3 years. Abdominal aortic aneurysm (AAA) screening. You may need this if you are a current or former smoker. Osteoporosis. You may be screened starting at age 93 if you are at high risk. Talk with your health care provider about your test results, treatment options, and if necessary, the need for more tests. Vaccines  Your health care provider may recommend certain vaccines, such as: Influenza vaccine. This is recommended every year. Tetanus, diphtheria, and acellular pertussis (Tdap, Td) vaccine. You may need a Td booster every 10 years. Zoster vaccine. You may need this after age 84. Pneumococcal 13-valent conjugate (PCV13) vaccine. One dose is recommended after age 8. Pneumococcal polysaccharide (PPSV23) vaccine. One dose is recommended after age 34. Talk to your health care  provider about which screenings and vaccines you need and how often you need them. This  information is not intended to replace advice given to you by your health care provider. Make sure you discuss any questions you have with your health care provider. Document Released: 10/31/2015 Document Revised: 06/23/2016 Document Reviewed: 08/05/2015 Elsevier Interactive Patient Education  2017 [place] Prevention in the Home Falls can cause injuries. They can happen to people of all ages. There are many things you can do to make your home safe and to help prevent falls. What can I do on the outside of my home? Regularly fix the edges of walkways and driveways and fix any cracks. Remove anything that might make you trip as you walk through a door, such as a raised step or threshold. Trim any bushes or trees on the path to your home. Use bright outdoor lighting. Clear any walking paths of anything that might make someone trip, such as rocks or tools. Regularly check to see if handrails are loose or broken. Make sure that both sides of any steps have handrails. Any raised decks and porches should have guardrails on the edges. Have any leaves, snow, or ice cleared regularly. Use sand or salt on walking paths during winter. Clean up any spills in your garage right away. This includes oil or grease spills. What can I do in the bathroom? Use night lights. Install grab bars by the toilet and in the tub and shower. Do not use towel bars as grab bars. Use non-skid mats or decals in the tub or shower. If you need to sit down in the shower, use a plastic, non-slip stool. Keep the floor dry. Clean up any water that spills on the floor as soon as it happens. Remove soap buildup in the tub or shower regularly. Attach bath mats securely with double-sided non-slip rug tape. Do not have throw rugs and other things on the floor that can make you trip. What can I do in the bedroom? Use night lights. Make sure that you have a light by your bed that is easy to reach. Do not use any sheets or  blankets that are too big for your bed. They should not hang down onto the floor. Have a firm chair that has side arms. You can use this for support while you get dressed. Do not have throw rugs and other things on the floor that can make you trip. What can I do in the kitchen? Clean up any spills right away. Avoid walking on wet floors. Keep items that you use a lot in easy-to-reach places. If you need to reach something above you, use a strong step stool that has a grab bar. Keep electrical cords out of the way. Do not use floor polish or wax that makes floors slippery. If you must use wax, use non-skid floor wax. Do not have throw rugs and other things on the floor that can make you trip. What can I do with my stairs? Do not leave any items on the stairs. Make sure that there are handrails on both sides of the stairs and use them. Fix handrails that are broken or loose. Make sure that handrails are as long as the stairways. Check any carpeting to make sure that it is firmly attached to the stairs. Fix any carpet that is loose or worn. Avoid having throw rugs at the top or bottom of the stairs. If you do have throw rugs, attach them to the  floor with carpet tape. Make sure that you have a light switch at the top of the stairs and the bottom of the stairs. If you do not have them, ask someone to add them for you. What else can I do to help prevent falls? Wear shoes that: Do not have high heels. Have rubber bottoms. Are comfortable and fit you well. Are closed at the toe. Do not wear sandals. If you use a stepladder: Make sure that it is fully opened. Do not climb a closed stepladder. Make sure that both sides of the stepladder are locked into place. Ask someone to hold it for you, if possible. Clearly mark and make sure that you can see: Any grab bars or handrails. First and last steps. Where the edge of each step is. Use tools that help you move around (mobility aids) if they are  needed. These include: Canes. Walkers. Scooters. Crutches. Turn on the lights when you go into a dark area. Replace any light bulbs as soon as they burn out. Set up your furniture so you have a clear path. Avoid moving your furniture around. If any of your floors are uneven, fix them. If there are any pets around you, be aware of where they are. Review your medicines with your doctor. Some medicines can make you feel dizzy. This can increase your chance of falling. Ask your doctor what other things that you can do to help prevent falls. This information is not intended to replace advice given to you by your health care provider. Make sure you discuss any questions you have with your health care provider. Document Released: 07/31/2009 Document Revised: 03/11/2016 Document Reviewed: 11/08/2014 Elsevier Interactive Patient Education  2017 [employer].

## 2021-05-14 NOTE — Progress Notes (Signed)
Subjective:   GRADEN SANDMEYER is a 75 y.o. male who presents for an Initial Medicare Annual Wellness Visit.  Review of Systems    N/a       Objective:    There were no vitals filed for this visit. There is no height or weight on file to calculate BMI.  Advanced Directives 06/10/2016 10/30/2015 12/03/2014 12/03/2014 11/01/2014 08/09/2014 07/12/2014  Does Patient Have a Medical Advance Directive? Yes Yes No No;Yes Yes No Yes  Type of Paramedic of [place];Living will [place];Living will - [place];Living will [place];Living will - [place];Living will  Does patient want to make changes to medical advance directive? - No - Patient declined - No - Patient declined No - Patient declined - No - Patient declined  Copy of [place] in Chart? No - copy requested No - copy requested - No - copy requested No - copy requested - Yes  Would patient like information on creating a medical advance directive? - - No - patient declined information - - No - patient declined information -  Pre-existing out of facility DNR order (yellow form or pink MOST form) - - - - - - -    Current Medications (verified) Outpatient Encounter Medications as of 05/14/2021  Medication Sig   indapamide (LOZOL) 2.5 MG tablet Take 2.5 mg by mouth daily.   loratadine (CLARITIN) 10 MG tablet Take 10 mg by mouth daily.   lovastatin (MEVACOR) 20 MG tablet Take 20 mg by mouth every evening.   Multiple Vitamin (MULTIVITAMIN) tablet Take 1 tablet by mouth daily.   No facility-administered encounter medications on file as of 05/14/2021.    Allergies (verified) Hydrocodone, Lipitor [atorvastatin], Pravastatin sodium, and Sulfa antibiotics   History: Past Medical History:  Diagnosis Date   Allergy    BPH (benign prostatic hypertrophy)    Chicken pox    GERD (gastroesophageal reflux disease)    History  of basal cell carcinoma excision    elbow   History of kidney stones    History of skin cancer    EXCISION MALIGNANT NEOPLASM   Hyperlipidemia    Hypogonadism male    Nocturia    Prostate nodule    Recurrent bladder papillary carcinoma ([place]) MONTIORED BY DR Gaynelle Arabian and oncologist-  dr Alen Blew   First dx Dec 2014--  S/P  TURBT's  and BCG and Mitomyocin C tx's   Renal calculus, bilateral    Renal cyst, acquired    Past Surgical History:  Procedure Laterality Date   CYSTOSCOPY W/ RETROGRADES Bilateral 10/30/2015   Procedure: CYSTOSCOPY WITH RETROGRADE PYELOGRAM;  Surgeon: Carolan Clines, MD;  Location: [facility];  Service: Urology;  Laterality: Bilateral;   HEMORRHOID BANDING     MD OFFICE   LAPAROSCOPIC CHOLECYSTECTOMY  02/24/2009   LASIK Bilateral 06/18/1989   TONSILLECTOMY  as child   TONSILLECTOMY  2017   TRANSURETHRAL RESECTION OF BLADDER TUMOR N/A 12/03/2013   Procedure: TRANSURETHRAL RESECTION OF BLADDER TUMOR (TURBT)AT BLADDER NECK 8CM, TRANSURETHRAL RESECTION OF PROSTATE;  Surgeon: Ailene Rud, MD;  Location: [facility];  Service: Urology;  Laterality: N/A;   TRANSURETHRAL RESECTION OF BLADDER TUMOR N/A 11/01/2014   Procedure: TRANSURETHRAL RESECTION OF BLADDER TUMOR (TURBT);  Surgeon: Ailene Rud, MD;  Location[facility];  Service: Urology;  Laterality: N/A;   TRANSURETHRAL RESECTION OF BLADDER TUMOR N/A 10/30/2015   Procedure: TRANSURETHRAL RESECTION  OF BLADDER TUMOR X2 (TURBT) WITH TAUBER FORCEPS;  Surgeon: Carolan Clines, MD;  Location: [facility];  Service: Urology;  Laterality: N/A;   TRANSURETHRAL RESECTION OF BLADDER TUMOR WITH GYRUS (TURBT-GYRUS) N/A 07/12/2014   Procedure: TRANSURETHRAL RESECTION OF BLADDER TUMOR WITH GYRUS (TURBT-GYRUS);  Surgeon: Ailene Rud, MD;  Location: [facility];  Service: Urology;  Laterality: N/A;   WISDOM TEETH EXTRACTED  10/19/2003   oral  surgeon office- bp dropped- but recovered   Family History  Problem Relation Age of Onset   Alcohol abuse Mother    Cancer Father 7       lung cancer   Alcohol abuse Father    Hyperlipidemia Daughter    Social History   Socioeconomic History   Marital status: Married    Spouse name: Not on file   Number of children: Not on file   Years of education: Not on file   Highest education level: Not on file  Occupational History   Not on file  Tobacco Use   Smoking status: Never   Smokeless tobacco: Never  Substance and Sexual Activity   Alcohol use: Yes    Alcohol/week: 14.0 standard drinks    Types: 14 Cans of beer per week    Comment: COUPLE  BEERS A DAY   Drug use: No   Sexual activity: Not on file  Other Topics Concern   Not on file  Social History Narrative   Not on file   Social Determinants of Health   Financial Resource Strain: Not on file  Food Insecurity: Not on file  Transportation Needs: Not on file  Physical Activity: Not on file  Stress: Not on file  Social Connections: Not on file    Tobacco Counseling Counseling given: Not Answered   Clinical Intake:                 Diabetic?no         Activities of Daily Living No flowsheet data found.  Patient Care Team: Eulas Post, MD as PCP - General (Family Medicine) Carolan Clines, MD (Inactive) as Consulting Physician (Urology)  Indicate any recent Medical Services you may have received from other than [facility] providers in the past year (date may be approximate).     Assessment:   This is a routine wellness examination for [place].  Hearing/Vision screen No results found.  Dietary issues and exercise activities discussed:     Goals Addressed   None    Depression Screen PHQ 2/9 Scores 09/18/2014  PHQ - 2 Score 0    Fall Risk Fall Risk  09/18/2014  Falls in the past year? No    FALL RISK PREVENTION PERTAINING TO THE HOME:  Any stairs in or around the home?  Yes  If so, are there any without handrails? No  Home free of loose throw rugs in walkways, pet beds, electrical cords, etc? Yes  Adequate lighting in your home to reduce risk of falls? Yes   ASSISTIVE DEVICES UTILIZED TO PREVENT FALLS:  Life alert? No  Use of a cane, walker or w/c? No  Grab bars in the bathroom? Yes  Shower chair or bench in shower? Yes  Elevated toilet seat or a handicapped toilet? No   TIMED UP AND GO:  Was the test performed? Yes .  Length of time to ambulate 10 feet: 8 sec.   Gait steady and fast without use of assistive device  Cognitive Function:    Normal cognitive  status assessed by direct observation by this Nurse Health Advisor. No abnormalities found.      Immunizations Immunization History  Administered Date(s) Administered   PFIZER Comirnaty(Gray Top)Covid-19 Tri-Sucrose Vaccine 08/26/2020    TDAP status: Up to date  Flu Vaccine status: Up to date  Pneumococcal vaccine status: Up to date  Covid-19 vaccine status: Completed vaccines  Qualifies for Shingles Vaccine? Yes   Zostavax completed No   Shingrix Completed?: No.    Education has been provided regarding the importance of this vaccine. Patient has been advised to call insurance company to determine out of pocket expense if they have not yet received this vaccine. Advised may also receive vaccine at local pharmacy or Health Dept. Verbalized acceptance and understanding.  Screening Tests Health Maintenance  Topic Date Due   Hepatitis C Screening  Never done   Zoster Vaccines- Shingrix (1 of 2) Never done   PNA vac Low Risk Adult (2 of 2 - PPSV23) 11/15/2014   COVID-19 Vaccine (4 - Booster) 11/26/2020   INFLUENZA VACCINE  05/18/2021   TETANUS/TDAP  05/20/2026   HPV VACCINES  Aged Out    Health Maintenance  Health Maintenance Due  Topic Date Due   Hepatitis C Screening  Never done   Zoster Vaccines- Shingrix (1 of 2) Never done   PNA vac Low Risk Adult (2 of 2 - PPSV23)  11/15/2014   COVID-19 Vaccine (4 - Booster) 11/26/2020    Colorectal cancer screening: No longer required.   Lung Cancer Screening: (Low Dose CT Chest recommended if Age 62-80 years, 30 pack-year currently smoking OR have quit w/in 15years.) does not qualify.   Lung Cancer Screening Referral: n/a  Additional Screening:  Hepatitis C Screening: does qualify;  Vision Screening: Recommended annual ophthalmology exams for early detection of glaucoma and other disorders of the eye. Is the patient up to date with their annual eye exam?  Yes  Who is the provider or what is the name of the office in which the patient attends annual eye exams? [facility]  If pt is not established with a provider, would they like to be referred to a provider to establish care? No .   Dental Screening: Recommended annual dental exams for proper oral hygiene  Community Resource Referral / Chronic Care Management: CRR required this visit?  No   CCM required this visit?  No      Plan:     I have personally reviewed and noted the following in the patient's chart:   Medical and social history Use of alcohol, tobacco or illicit drugs  Current medications and supplements including opioid prescriptions. Patient is not currently taking opioid prescriptions. Functional ability and status Nutritional status Physical activity Advanced directives List of other physicians Hospitalizations, surgeries, and ER visits in previous 12 months Vitals Screenings to include cognitive, depression, and falls Referrals and appointments  In addition, I have reviewed and discussed with patient certain preventive protocols, quality metrics, and best practice recommendations. A written personalized care plan for preventive services as well as general preventive health recommendations were provided to patient.     Randel Pigg, LPN   [id number]   Nurse Notes: none

## 2021-06-02 DIAGNOSIS — L812 Freckles: Secondary | ICD-10-CM | POA: Diagnosis not present

## 2021-06-02 DIAGNOSIS — L570 Actinic keratosis: Secondary | ICD-10-CM | POA: Diagnosis not present

## 2021-06-02 DIAGNOSIS — D1801 Hemangioma of skin and subcutaneous tissue: Secondary | ICD-10-CM | POA: Diagnosis not present

## 2021-06-02 DIAGNOSIS — D0461 Carcinoma in situ of skin of right upper limb, including shoulder: Secondary | ICD-10-CM | POA: Diagnosis not present

## 2021-06-02 DIAGNOSIS — D485 Neoplasm of uncertain behavior of skin: Secondary | ICD-10-CM | POA: Diagnosis not present

## 2021-06-02 DIAGNOSIS — L821 Other seborrheic keratosis: Secondary | ICD-10-CM | POA: Diagnosis not present

## 2021-06-25 DIAGNOSIS — N138 Other obstructive and reflux uropathy: Secondary | ICD-10-CM | POA: Diagnosis not present

## 2021-06-25 DIAGNOSIS — N401 Enlarged prostate with lower urinary tract symptoms: Secondary | ICD-10-CM | POA: Diagnosis not present

## 2021-06-25 DIAGNOSIS — Z8551 Personal history of malignant neoplasm of bladder: Secondary | ICD-10-CM | POA: Diagnosis not present

## 2021-06-25 DIAGNOSIS — N200 Calculus of kidney: Secondary | ICD-10-CM | POA: Diagnosis not present

## 2021-07-07 ENCOUNTER — Other Ambulatory Visit: Payer: Self-pay

## 2021-07-07 ENCOUNTER — Encounter: Payer: Self-pay | Admitting: Family Medicine

## 2021-07-07 MED ORDER — LOVASTATIN 20 MG PO TABS: 20.0000 mg | ORAL_TABLET | Freq: Every evening | ORAL | 3 refills | Status: DC

## 2021-07-15 DIAGNOSIS — Z23 Encounter for immunization: Secondary | ICD-10-CM | POA: Diagnosis not present

## 2021-07-22 DIAGNOSIS — H25813 Combined forms of age-related cataract, bilateral: Secondary | ICD-10-CM | POA: Diagnosis not present

## 2021-10-12 ENCOUNTER — Emergency Department (HOSPITAL_BASED_OUTPATIENT_CLINIC_OR_DEPARTMENT_OTHER): Payer: Medicare Other | Admitting: Radiology

## 2021-10-12 ENCOUNTER — Emergency Department (HOSPITAL_BASED_OUTPATIENT_CLINIC_OR_DEPARTMENT_OTHER)
Admission: EM | Admit: 2021-10-12 | Discharge: 2021-10-12 | Disposition: A | Discharge status: Home / Self Care | Payer: Medicare Other | Attending: Emergency Medicine | Admitting: Emergency Medicine

## 2021-10-12 ENCOUNTER — Encounter (HOSPITAL_BASED_OUTPATIENT_CLINIC_OR_DEPARTMENT_OTHER): Payer: Self-pay | Admitting: *Deleted

## 2021-10-12 ENCOUNTER — Other Ambulatory Visit: Payer: Self-pay

## 2021-10-12 DIAGNOSIS — R059 Cough, unspecified: Secondary | ICD-10-CM | POA: Diagnosis not present

## 2021-10-12 DIAGNOSIS — Z20822 Contact with and (suspected) exposure to covid-19: Secondary | ICD-10-CM | POA: Diagnosis not present

## 2021-10-12 DIAGNOSIS — Z85828 Personal history of other malignant neoplasm of skin: Secondary | ICD-10-CM | POA: Diagnosis not present

## 2021-10-12 DIAGNOSIS — E860 Dehydration: Secondary | ICD-10-CM | POA: Diagnosis not present

## 2021-10-12 DIAGNOSIS — J40 Bronchitis, not specified as acute or chronic: Secondary | ICD-10-CM | POA: Diagnosis not present

## 2021-10-12 DIAGNOSIS — I10 Essential (primary) hypertension: Secondary | ICD-10-CM | POA: Diagnosis not present

## 2021-10-12 DIAGNOSIS — Z8551 Personal history of malignant neoplasm of bladder: Secondary | ICD-10-CM | POA: Insufficient documentation

## 2021-10-12 DIAGNOSIS — R0981 Nasal congestion: Secondary | ICD-10-CM | POA: Diagnosis present

## 2021-10-12 LAB — URINALYSIS, ROUTINE W REFLEX MICROSCOPIC
Bilirubin Urine: NEGATIVE
Glucose, UA: NEGATIVE mg/dL
Hgb urine dipstick: NEGATIVE
Leukocytes,Ua: NEGATIVE
Nitrite: NEGATIVE
Protein, ur: NEGATIVE mg/dL
Specific Gravity, Urine: 1.014 (ref 1.005–1.030)
pH: 6.5 (ref 5.0–8.0)

## 2021-10-12 LAB — CBC
HCT: 42.6 % (ref 39.0–52.0)
Hemoglobin: 14.6 g/dL (ref 13.0–17.0)
MCH: 31.1 pg (ref 26.0–34.0)
MCHC: 34.3 g/dL (ref 30.0–36.0)
MCV: 90.6 fL (ref 80.0–100.0)
Platelets: 200 10*3/uL (ref 150–400)
RBC: 4.7 MIL/uL (ref 4.22–5.81)
RDW: 12.1 % (ref 11.5–15.5)
WBC: 7 10*3/uL (ref 4.0–10.5)
nRBC: 0 % (ref 0.0–0.2)

## 2021-10-12 LAB — BASIC METABOLIC PANEL
Anion gap: 6 (ref 5–15)
BUN: 11 mg/dL (ref 8–23)
CO2: 27 mmol/L (ref 22–32)
Calcium: 9.7 mg/dL (ref 8.9–10.3)
Chloride: 97 mmol/L — ABNORMAL LOW (ref 98–111)
Creatinine, Ser: 0.78 mg/dL (ref 0.61–1.24)
GFR, Estimated: >60 mL/min (ref >60)
Glucose, Bld: 89 mg/dL (ref 70–99)
Potassium: 3.4 mmol/L — ABNORMAL LOW (ref 3.5–5.1)
Sodium: 130 mmol/L — ABNORMAL LOW (ref 135–145)

## 2021-10-12 LAB — RESP PANEL BY RT-PCR (FLU A&B, COVID) ARPGX2
Influenza A by PCR: NEGATIVE
Influenza B by PCR: NEGATIVE
SARS Coronavirus 2 by RT PCR: NEGATIVE

## 2021-10-12 MED ORDER — ALBUTEROL SULFATE HFA 108 (90 BASE) MCG/ACT IN AERS: 2.0000 | INHALATION_SPRAY | RESPIRATORY_TRACT | Status: DC | PRN

## 2021-10-12 MED ORDER — LACTATED RINGERS IV BOLUS
1000.0000 mL | Freq: Once | INTRAVENOUS | Status: AC
  Administered 2021-10-12: 12:00:00 1000 mL via INTRAVENOUS

## 2021-10-12 MED ORDER — BENZONATATE 100 MG PO CAPS: 100.0000 mg | ORAL_CAPSULE | Freq: Three times a day (TID) | ORAL | 0 refills | Status: DC

## 2021-10-12 NOTE — ED Provider Notes (Signed)
[facility] EMERGENCY DEPT Provider Note   CSN: [id number] Arrival date & time: 10/12/21  1005     History Chief Complaint  Patient presents with   Fall   Loss of Consciousness    Jeffrey Horn is a 75 y.o. male.  Patient is a 75 year old male with a history of GERD, BPH, bladder carcinoma status posttreatment who reports that on Wednesday he started getting URI symptoms with cough, congestion, runny nose and on early Saturday morning he had gotten up to go to the bathroom and after using the bathroom he started feeling some nausea and then had an episode of syncope falling in the bathroom and hitting his chin.  He called his doctor Saturday morning and had EMS come out and check on him and at that time he was found to have normal oxygen saturation but his blood pressure dropped when he went from sitting to standing.  He did not want to come to the hospital at that time but since that time his wife reports his cough has become worse and is now productive of a yellow sputum.  He continues to have nasal congestion and is unable to sleep at night because he is coughing all night.  He has not experienced any significant shortness of breath and has had no fever.  He did do a home COVID test which was negative.  He is still been eating but his appetite has not been as robust.  He denies any further episodes of syncope or dizziness with standing.  He has had no speech chest pain or palpitations.  He did take Sudafed yesterday and felt that it has helped the most of anything he is taken so far.  The history is provided by the patient and the spouse.  Fall  Loss of Consciousness     Past Medical History:  Diagnosis Date   Allergy    BPH (benign prostatic hypertrophy)    Chicken pox    GERD (gastroesophageal reflux disease)    History of basal cell carcinoma excision    elbow   History of kidney stones    History of skin cancer    EXCISION MALIGNANT NEOPLASM    Hyperlipidemia    Hypogonadism male    Nocturia    Prostate nodule    Recurrent bladder papillary carcinoma ([place]) MONTIORED BY DR Gaynelle Arabian and oncologist-  dr Alen Blew   First dx Dec 2014--  S/P  TURBT's  and BCG and Mitomyocin C tx's   Renal calculus, bilateral    Renal cyst, acquired     Patient Active Problem List   Diagnosis Date Noted   History of kidney stones 04/13/2021   GERD (gastroesophageal reflux disease) 04/13/2021   Hyperlipidemia 04/13/2021   Bladder cancer ([place]) 12/03/2013    Past Surgical History:  Procedure Laterality Date   CYSTOSCOPY W/ RETROGRADES Bilateral 10/30/2015   Procedure: CYSTOSCOPY WITH RETROGRADE PYELOGRAM;  Surgeon: Carolan Clines, MD;  Location: [facility];  Service: Urology;  Laterality: Bilateral;   HEMORRHOID BANDING     MD OFFICE   LAPAROSCOPIC CHOLECYSTECTOMY  02/24/2009   LASIK Bilateral 06/18/1989   TONSILLECTOMY  as child   TONSILLECTOMY  2017   TRANSURETHRAL RESECTION OF BLADDER TUMOR N/A 12/03/2013   Procedure: TRANSURETHRAL RESECTION OF BLADDER TUMOR (TURBT)AT BLADDER NECK 8CM, TRANSURETHRAL RESECTION OF PROSTATE;  Surgeon: Ailene Rud, MD;  Location: [facility];  Service: Urology;  Laterality: N/A;   TRANSURETHRAL RESECTION OF BLADDER TUMOR N/A 11/01/2014   Procedure:  TRANSURETHRAL RESECTION OF BLADDER TUMOR (TURBT);  Surgeon: Ailene Rud, MD;  Location: [facility];  Service: Urology;  Laterality: N/A;   TRANSURETHRAL RESECTION OF BLADDER TUMOR N/A 10/30/2015   Procedure: TRANSURETHRAL RESECTION OF BLADDER TUMOR X2 (TURBT) WITH TAUBER FORCEPS;  Surgeon: Carolan Clines, MD;  Location: [place];  Service: Urology;  Laterality: N/A;   TRANSURETHRAL RESECTION OF BLADDER TUMOR WITH GYRUS (TURBT-GYRUS) N/A 07/12/2014   Procedure: TRANSURETHRAL RESECTION OF BLADDER TUMOR WITH GYRUS (TURBT-GYRUS);  Surgeon: Ailene Rud, MD;  Location: [facility];   Service: Urology;  Laterality: N/A;   WISDOM TEETH EXTRACTED  10/19/2003   oral surgeon office- bp dropped- but recovered       Family History  Problem Relation Age of Onset   Alcohol abuse Mother    Cancer Father 70       lung cancer   Alcohol abuse Father    Hyperlipidemia Daughter     Social History   Tobacco Use   Smoking status: Never   Smokeless tobacco: Never  Vaping Use   Vaping Use: Never used  Substance Use Topics   Alcohol use: Yes    Alcohol/week: 14.0 standard drinks    Types: 14 Cans of beer per week    Comment: COUPLE  BEERS A DAY   Drug use: No    Home Medications Prior to Admission medications   Medication Sig Start Date End Date Taking? Authorizing Provider  benzonatate (TESSALON) 100 MG capsule Take 1 capsule (100 mg total) by mouth every 8 (eight) hours. 10/12/21  Yes Rhilynn Preyer, Loree Fee, MD  indapamide (LOZOL) 2.5 MG tablet Take 2.5 mg by mouth daily.    [provider]  loratadine (CLARITIN) 10 MG tablet Take 10 mg by mouth daily.    [provider]  lovastatin (MEVACOR) 20 MG tablet Take 1 tablet (20 mg total) by mouth every evening. 07/07/21   Burchette, Alinda Sierras, MD  Multiple Vitamin (MULTIVITAMIN) tablet Take 1 tablet by mouth daily.    [provider]    Allergies    Hydrocodone, Lipitor [atorvastatin], Pravastatin sodium, and Sulfa antibiotics  Review of Systems   Review of Systems  Cardiovascular:  Positive for syncope.  All other systems reviewed and are negative.  Physical Exam Updated Vital Signs BP 120/75    Pulse (!) 57    Temp 98.6 F (37 C)    Resp 17    Ht 5\' 10"  (1.778 m)    Wt 79.4 kg    SpO2 94%    BMI 25.11 kg/m   Physical Exam Vitals and nursing note reviewed.  Constitutional:      General: He is not in acute distress.    Appearance: He is well-developed.  HENT:     Head: Normocephalic and atraumatic.     Nose: Nose normal.     Mouth/Throat:     Mouth: Mucous membranes are moist.  Eyes:      Conjunctiva/sclera: Conjunctivae normal.     Pupils: Pupils are equal, round, and reactive to light.  Cardiovascular:     Rate and Rhythm: Normal rate and regular rhythm.     Heart sounds: No murmur heard. Pulmonary:     Effort: Pulmonary effort is normal. No respiratory distress.     Breath sounds: Examination of the right-lower field reveals rhonchi. Examination of the left-lower field reveals rhonchi. Rhonchi present. No wheezing or rales.  Abdominal:     General: There is no distension.  Palpations: Abdomen is soft.     Tenderness: There is no abdominal tenderness. There is no guarding or rebound.  Musculoskeletal:        General: No tenderness. Normal range of motion.     Cervical back: Normal range of motion and neck supple.     Right lower leg: No edema.     Left lower leg: No edema.  Skin:    General: Skin is warm and dry.     Findings: No erythema or rash.  Neurological:     Mental Status: He is alert and oriented to person, place, and time. Mental status is at baseline.  Psychiatric:        Mood and Affect: Mood normal.        Behavior: Behavior normal.    ED Results / Procedures / Treatments   Labs (all labs ordered are listed, but only abnormal results are displayed) Labs Reviewed  BASIC METABOLIC PANEL - Abnormal; Notable for the following components:      Result Value   Sodium 130 (*)    Potassium 3.4 (*)    Chloride 97 (*)    All other components within normal limits  URINALYSIS, ROUTINE W REFLEX MICROSCOPIC - Abnormal; Notable for the following components:   Ketones, ur TRACE (*)    All other components within normal limits  RESP PANEL BY RT-PCR (FLU A&B, COVID) [id number]  CBC    EKG EKG Interpretation  Date/Time:  Monday October 12 2021 10:59:38 EST Ventricular Rate:  78 PR Interval:  144 QRS Duration: 76 QT Interval:  374 QTC Calculation: 426 R Axis:   -7 Text Interpretation: Normal sinus rhythm Normal ECG No significant change since last  tracing Confirmed by Blanchie Dessert [phone]) on 10/12/2021 11:24:44 AM  Radiology DG Chest 2 View  Result Date: 10/12/2021 CLINICAL DATA:  Cough EXAM: CHEST - 2 VIEW COMPARISON:  02/22/2009 FINDINGS: The heart size and mediastinal contours are within normal limits. Azygous lobe noted, a anatomic variant. No focal airspace consolidation, pleural effusion, or pneumothorax. The visualized skeletal structures are unremarkable. IMPRESSION: No active cardiopulmonary disease. Electronically Signed   By: Davina Poke D.O.   On: 10/12/2021 11:33    Procedures Procedures   Medications Ordered in ED Medications  lactated ringers bolus 1,000 mL ( Intravenous Stopped 10/12/21 1308)    ED Course  I have reviewed the triage vital signs and the nursing notes.  Pertinent labs & imaging results that were available during my care of the patient were reviewed by me and considered in my medical decision making (see chart for details).    MDM Rules/Calculators/A&P                         Patient is a 75 year old male presenting today with URI symptoms as well as an episode of syncope 2 days ago.  He is negative for COVID and flu and syncope was most likely from orthostasis as he does have a drop in his blood pressure with standing some of it may be related to dehydration and others due to his medications. He currently has some rhonchi on exam but chest x-ray based on my interpretation is clear and radiology also agrees that it is negative.  Patient's BMP with mild hyponatremia of 130 today but normal renal function and UA within normal limits.  CBC is also within normal limits.  Patient is well-appearing on exam with oxygen saturation of 97% on room air.  Feel  that patient most likely has bronchitis.  Most likely viral etiology causing his symptoms.  He was given 1 L of fluid.  He was given an inhaler to use and Tessalon Perles for the cough.  He was given return precautions.  EKG is within normal limits  today and low suspicion that syncope was related to an acute dysrhythmia or cardiac event.  MDM   Amount and/or Complexity of Data Reviewed Clinical lab tests: ordered and reviewed Tests in the radiology section of CPT: ordered and reviewed Tests in the medicine section of CPT: ordered and reviewed Independent visualization of images, tracings, or specimens: yes       Final Clinical Impression(s) / ED Diagnoses Final diagnoses:  Bronchitis  Dehydration    Rx / DC Orders ED Discharge Orders          Ordered    benzonatate (TESSALON) 100 MG capsule  Every 8 hours        10/12/21 1226             Blanchie Dessert, MD 10/12/21 1342

## 2021-10-12 NOTE — ED Notes (Signed)
Pt provided discharge instructions and prescription information. Pt was given the opportunity to ask questions and questions were answered. Discharge signature not obtained in the setting of the COVID-19 pandemic in order to reduce high touch surfaces.  ° °

## 2021-10-12 NOTE — ED Triage Notes (Signed)
Pt has had a cough with green sputum, has been taking advil and musinex for symptoms. Loose BM today, decreased appetite, syncopal episode Sat morning 0400 while using bathroom.

## 2021-10-18 ENCOUNTER — Telehealth (HOSPITAL_BASED_OUTPATIENT_CLINIC_OR_DEPARTMENT_OTHER): Payer: Self-pay | Admitting: Emergency Medicine

## 2021-10-18 MED ORDER — ALBUTEROL SULFATE HFA 108 (90 BASE) MCG/ACT IN AERS: 2.0000 | INHALATION_SPRAY | RESPIRATORY_TRACT | 0 refills | Status: DC | PRN

## 2021-10-18 NOTE — Telephone Encounter (Signed)
Patient reports he was opposed to gotten an albuterol inhaler but was not at the pharmacy.  He is requesting albuterol inhaler be [facility] [place].  Patient had diagnosis of bronchitis, inhaler prescribed and [facility] to [facility] on [address]

## 2021-10-21 ENCOUNTER — Ambulatory Visit (INDEPENDENT_AMBULATORY_CARE_PROVIDER_SITE_OTHER): Payer: Medicare Other | Admitting: Family Medicine

## 2021-10-21 ENCOUNTER — Encounter: Payer: Self-pay | Admitting: Family Medicine

## 2021-10-21 VITALS — BP 124/80 | HR 73 | Temp 97.8°F | Ht 70.0 in | Wt 180.3 lb

## 2021-10-21 DIAGNOSIS — R062 Wheezing: Secondary | ICD-10-CM

## 2021-10-21 DIAGNOSIS — R55 Syncope and collapse: Secondary | ICD-10-CM | POA: Diagnosis not present

## 2021-10-21 DIAGNOSIS — R059 Cough, unspecified: Secondary | ICD-10-CM

## 2021-10-21 MED ORDER — AMOXICILLIN-POT CLAVULANATE 875-125 MG PO TABS: 1.0000 | ORAL_TABLET | Freq: Two times a day (BID) | ORAL | 0 refills | Status: DC

## 2021-10-21 MED ORDER — PREDNISONE 20 MG PO TABS: ORAL_TABLET | ORAL | 0 refills | Status: DC

## 2021-10-21 NOTE — Progress Notes (Signed)
Established Patient Office Visit  Subjective:  Patient ID: Jeffrey Horn, male    DOB: [birth year]  Age: 76 y.o. MRN: [id number]  CC:  Chief Complaint  Patient presents with   Hospitalization Follow-up    HPI Jeffrey Horn presents for recent syncopal episode and persistent cough.  Patient relates about 3 weeks ago he developed typical URI symptoms which she thought was a "cold ".  By Christmas eve he developed some nausea.  He went to the bathroom around 4 AM and apparently had syncopal episode and fell forward into the toilet.  He ended up cutting his chin.  He hit left side of chest.  Wife states he was unconscious for about 5 minutes.  He was breathing.  She called 911.  Unfortunately, fire department came to neighbor's house and they ended up leaving with never seen the patient.  They were then instructed after talking to on-call nurse to call back EMS and they came later that morning and apparently had orthostatic change in blood pressure.  He was instructed to stop Lozol.  He was instructed to go to the ER but he declined.  He continued to have severe cough and increasing productive cough and went to the ER on the 26.  Chest x-ray showed no acute findings.  He had lab work.  CBC normal.  COVID testing and influenza negative.  Potassium 3.4.  Sodium 130.  Patient was prescribed Tessalon and inhaler.  Was felt to have viral process. His EKG was unremarkable.  Has had no fever.  No further syncope or dizziness.  Increased coughing.  Feels like he has some wheezing intermittently.  No history of asthma.  Cough is fairly severe at night.  Productive of thick sputum.  Does have some sinus pressure.   Past Medical History:  Diagnosis Date   Allergy    BPH (benign prostatic hypertrophy)    Chicken pox    GERD (gastroesophageal reflux disease)    History of basal cell carcinoma excision    elbow   History of kidney stones    History of skin cancer    EXCISION MALIGNANT NEOPLASM    Hyperlipidemia    Hypogonadism male    Nocturia    Prostate nodule    Recurrent bladder papillary carcinoma ([place]) MONTIORED BY DR Gaynelle Arabian and oncologist-  dr Alen Blew   First dx Dec 2014--  S/P  TURBT's  and BCG and Mitomyocin C tx's   Renal calculus, bilateral    Renal cyst, acquired     Past Surgical History:  Procedure Laterality Date   CYSTOSCOPY W/ RETROGRADES Bilateral 10/30/2015   Procedure: CYSTOSCOPY WITH RETROGRADE PYELOGRAM;  Surgeon: Carolan Clines, MD;  Location: [facility];  Service: Urology;  Laterality: Bilateral;   HEMORRHOID BANDING     MD OFFICE   LAPAROSCOPIC CHOLECYSTECTOMY  02/24/2009   LASIK Bilateral 06/18/1989   TONSILLECTOMY  as child   TONSILLECTOMY  2017   TRANSURETHRAL RESECTION OF BLADDER TUMOR N/A 12/03/2013   Procedure: TRANSURETHRAL RESECTION OF BLADDER TUMOR (TURBT)AT BLADDER NECK 8CM, TRANSURETHRAL RESECTION OF PROSTATE;  Surgeon: Ailene Rud, MD;  Location: [facility];  Service: Urology;  Laterality: N/A;   TRANSURETHRAL RESECTION OF BLADDER TUMOR N/A 11/01/2014   Procedure: TRANSURETHRAL RESECTION OF BLADDER TUMOR (TURBT);  Surgeon: Ailene Rud, MD;  [facility]  Service: Urology;  Laterality: N/A;   TRANSURETHRAL RESECTION OF BLADDER TUMOR N/A 10/30/2015   Procedure: TRANSURETHRAL RESECTION OF BLADDER TUMOR X2 (  TURBT) WITH TAUBER FORCEPS;  Surgeon: Carolan Clines, MD;  Location: [facility];  Service: Urology;  Laterality: N/A;   TRANSURETHRAL RESECTION OF BLADDER TUMOR WITH GYRUS (TURBT-GYRUS) N/A 07/12/2014   Procedure: TRANSURETHRAL RESECTION OF BLADDER TUMOR WITH GYRUS (TURBT-GYRUS);  Surgeon: Ailene Rud, MD;  Location: [facility];  Service: Urology;  Laterality: N/A;   [place] EXTRACTED  10/19/2003   oral surgeon office- bp dropped- but recovered    Family History  Problem Relation Age of Onset   Alcohol abuse Mother    Cancer  Father 51       lung cancer   Alcohol abuse Father    Hyperlipidemia Daughter     Social History   Socioeconomic History   Marital status: Married    Spouse name: Not on file   Number of children: Not on file   Years of education: Not on file   Highest education level: Not on file  Occupational History   Not on file  Tobacco Use   Smoking status: Never   Smokeless tobacco: Never  Vaping Use   Vaping Use: Never used  Substance and Sexual Activity   Alcohol use: Yes    Alcohol/week: 14.0 standard drinks    Types: 14 Cans of beer per week    Comment: COUPLE  BEERS A DAY   Drug use: No   Sexual activity: Not on file  Other Topics Concern   Not on file  Social History Narrative   Not on file   Social Determinants of Health   Financial Resource Strain: Low Risk    Difficulty of Paying Living Expenses: Not hard at all  Food Insecurity: No Food Insecurity   Worried About Charity fundraiser in the Last Year: Never true   Ran Out of Food in the Last Year: Never true  Transportation Needs: No Transportation Needs   Lack of Transportation (Medical): No   Lack of Transportation (Non-Medical): No  Physical Activity: Insufficiently Active   Days of Exercise per Week: 3 days   Minutes of Exercise per Session: 30 min  Stress: No Stress Concern Present   Feeling of Stress : Not at all  Social Connections: Moderately Integrated   Frequency of Communication with Friends and Family: Three times a week   Frequency of Social Gatherings with Friends and Family: Three times a week   Attends Religious Services: Never   Active Member of Clubs or Organizations: Yes   Attends Music therapist: More than 4 times per year   Marital Status: Married  Human resources officer Violence: Not At Risk   Fear of Current or Ex-Partner: No   Emotionally Abused: No   Physically Abused: No   Sexually Abused: No    Outpatient Medications Prior to Visit  Medication Sig Dispense Refill    albuterol (VENTOLIN HFA) 108 (90 Base) MCG/ACT inhaler Inhale 2 puffs into the lungs every 4 (four) hours as needed for wheezing or shortness of breath. 8 g 0   indapamide (LOZOL) 2.5 MG tablet Take 2.5 mg by mouth daily.     loratadine (CLARITIN) 10 MG tablet Take 10 mg by mouth daily.     lovastatin (MEVACOR) 20 MG tablet Take 1 tablet (20 mg total) by mouth every evening. 90 tablet 3   Multiple Vitamin (MULTIVITAMIN) tablet Take 1 tablet by mouth daily.     benzonatate (TESSALON) 100 MG capsule Take 1 capsule (100 mg total) by mouth every 8 (eight) hours.  21 capsule 0   No facility-administered medications prior to visit.    Allergies  Allergen Reactions   Hydrocodone Other (See Comments)    HALLUCINATIONS   Lipitor [Atorvastatin] Other (See Comments)    "muscle soreness"   Pravastatin Sodium Other (See Comments)    INCREASED CK   Sulfa Antibiotics Rash    ROS Review of Systems  Constitutional:  Negative for chills and fever.  Respiratory:  Positive for cough and wheezing.   Cardiovascular:  Negative for chest pain, palpitations and leg swelling.  Neurological:  Negative for headaches.       See HPI  Psychiatric/Behavioral:  Negative for confusion.      Objective:    Physical Exam Constitutional:      Appearance: He is well-developed.  HENT:     Right Ear: External ear normal.     Left Ear: External ear normal.  Eyes:     Pupils: Pupils are equal, round, and reactive to light.  Neck:     Thyroid: No thyromegaly.  Cardiovascular:     Rate and Rhythm: Normal rate and regular rhythm.  Pulmonary:     Effort: Pulmonary effort is normal. No respiratory distress.     Breath sounds: Wheezing present. No rales.     Comments: Does have some diffuse wheezes.  Pulse oximetry is 97%. Musculoskeletal:     Cervical back: Neck supple.  Neurological:     Mental Status: He is alert and oriented to person, place, and time.    BP 124/80    Pulse 73    Temp 97.8 F (36.6 C)  (Oral)    Ht 5\' 10"  (1.778 m)    Wt 180 lb 5 oz (81.8 kg)    SpO2 97%    BMI 25.87 kg/m  Wt Readings from Last 3 Encounters:  10/21/21 180 lb 5 oz (81.8 kg)  10/12/21 175 lb (79.4 kg)  05/14/21 175 lb (79.4 kg)     Health Maintenance Due  Topic Date Due   Hepatitis C Screening  Never done   Zoster Vaccines- Shingrix (1 of 2) Never done   Pneumonia Vaccine 44+ Years old (74 - PPSV23 if available, else PCV20) 11/15/2014   COVID-19 Vaccine (4 - Booster) 10/21/2020    There are no preventive care reminders to display for this patient.  No results found for: TSH Lab Results  Component Value Date   WBC 7.0 10/12/2021   HGB 14.6 10/12/2021   HCT 42.6 10/12/2021   MCV 90.6 10/12/2021   PLT 200 10/12/2021   Lab Results  Component Value Date   NA 130 (L) 10/12/2021   K 3.4 (L) 10/12/2021   CO2 27 10/12/2021   GLUCOSE 89 10/12/2021   BUN 11 10/12/2021   CREATININE 0.78 10/12/2021   BILITOT 0.7 04/13/2021   ALKPHOS 50 04/13/2021   AST 23 04/13/2021   ALT 32 04/13/2021   PROT 6.6 04/13/2021   ALBUMIN 4.4 04/13/2021   CALCIUM 9.7 10/12/2021   ANIONGAP 6 10/12/2021   GFR 83.35 04/13/2021   Lab Results  Component Value Date   CHOL 202 (H) 04/13/2021   Lab Results  Component Value Date   HDL 58.10 04/13/2021   Lab Results  Component Value Date   LDLCALC 111 (H) 04/13/2021   Lab Results  Component Value Date   TRIG 162.0 (H) 04/13/2021   Lab Results  Component Value Date   CHOLHDL 3 04/13/2021   No results found for: HGBA1C    Assessment &  Plan:   #1 recent syncopal episode.  Differential is orthostatic versus vasovagal as he did have some nausea and increased coughing prior to his syncope.  ER work-up reviewed.  No further episodes since then.  No dizziness at this time.  No orthostatic symptoms.  Follow-up promptly for any recurrent dizziness or orthostasis.  We have advised him to leave off the Lozol for time being  #2 cough with reactive airway component.  No  fever.  Recent chest x-ray unremarkable.  Even though this may still be all viral given the fact his symptoms are not improving we decided to go and cover with Augmentin 875 mg twice daily for 10 days and start prednisone 20 mg 2 tablets daily for 5 days and continue albuterol inhaler as needed.  Call if not improving over the next week or so be in touch  Meds ordered this encounter  Medications   predniSONE (DELTASONE) 20 MG tablet    Sig: Take two tablets once daily for 5 days    Dispense:  10 tablet    Refill:  0   amoxicillin-clavulanate (AUGMENTIN) 875-125 MG tablet    Sig: Take 1 tablet by mouth 2 (two) times daily.    Dispense:  20 tablet    Refill:  0    Follow-up: No follow-ups on file.    Carolann Littler, MD

## 2021-10-21 NOTE — Patient Instructions (Signed)
Follow up immediately for any fever or increased shortness of breath. 

## 2021-11-11 ENCOUNTER — Ambulatory Visit (INDEPENDENT_AMBULATORY_CARE_PROVIDER_SITE_OTHER): Payer: Medicare Other | Admitting: Family Medicine

## 2021-11-11 ENCOUNTER — Other Ambulatory Visit: Payer: Self-pay

## 2021-11-11 ENCOUNTER — Ambulatory Visit (INDEPENDENT_AMBULATORY_CARE_PROVIDER_SITE_OTHER): Payer: Medicare Other

## 2021-11-11 VITALS — BP 124/80 | HR 80 | Temp 97.9°F | Wt 182.1 lb

## 2021-11-11 DIAGNOSIS — R0789 Other chest pain: Secondary | ICD-10-CM

## 2021-11-11 NOTE — Progress Notes (Signed)
Established Patient Office Visit  Subjective:  Patient ID: Jeffrey Horn, male    DOB: 09-29-1946  Age: 76 y.o. MRN: [id number]  CC:  Chief Complaint  Patient presents with   Follow-up    Chest is still very sore, thinks he may have hit it when he fell    HPI Jeffrey Horn presents for some persistent left chest wall pain.  Refer to previous note for details.  He had some cough and actually Christmas eve had some nausea and went to the bathroom had syncopal episode.  Was felt this was probably vasovagal.  Has had no dizziness whatsoever since then no further syncope.  He had chest x-ray that time which showed no pneumonia no acute findings.  He generally feels well overall.  His cough is fully resolved.  He has some left chest wall soreness which can be very sharp and fairly intense at times and is worse with sneezing and movement.  No pleuritic pain.  He took some Advil yesterday which did seem to help.  Appetite and weight are stable.  Recent labs were reassuring except for potassium 3.4.  He had been on Lozol and was instructed to hold that.  He does not describe any orthostatic symptoms now.  His main concern was the duration of the soreness.  Past Medical History:  Diagnosis Date   Allergy    BPH (benign prostatic hypertrophy)    Chicken pox    GERD (gastroesophageal reflux disease)    History of basal cell carcinoma excision    elbow   History of kidney stones    History of skin cancer    EXCISION MALIGNANT NEOPLASM   Hyperlipidemia    Hypogonadism male    Nocturia    Prostate nodule    Recurrent bladder papillary carcinoma ([place]) MONTIORED BY DR Gaynelle Arabian and oncologist-  dr Alen Blew   First dx Dec 2014--  S/P  TURBT's  and BCG and Mitomyocin C tx's   Renal calculus, bilateral    Renal cyst, acquired     Past Surgical History:  Procedure Laterality Date   CYSTOSCOPY W/ RETROGRADES Bilateral 10/30/2015   Procedure: CYSTOSCOPY WITH RETROGRADE PYELOGRAM;  Surgeon:  Carolan Clines, MD;  Location: [place];  Service: Urology;  Laterality: Bilateral;   HEMORRHOID BANDING     MD OFFICE   LAPAROSCOPIC CHOLECYSTECTOMY  02/24/2009   LASIK Bilateral 06/18/1989   TONSILLECTOMY  as child   TONSILLECTOMY  2017   TRANSURETHRAL RESECTION OF BLADDER TUMOR N/A 12/03/2013   Procedure: TRANSURETHRAL RESECTION OF BLADDER TUMOR (TURBT)AT BLADDER NECK 8CM, TRANSURETHRAL RESECTION OF PROSTATE;  Surgeon: Ailene Rud, MD;  Location: [facility];  Service: Urology;  Laterality: N/A;   TRANSURETHRAL RESECTION OF BLADDER TUMOR N/A 11/01/2014   Procedure: TRANSURETHRAL RESECTION OF BLADDER TUMOR (TURBT);  Surgeon: Ailene Rud, MD;  Location[facility];  Service: Urology;  Laterality: N/A;   TRANSURETHRAL RESECTION OF BLADDER TUMOR N/A 10/30/2015   Procedure: TRANSURETHRAL RESECTION OF BLADDER TUMOR X2 (TURBT) WITH TAUBER FORCEPS;  Surgeon: Carolan Clines, MD;  Location: [place];  Service: Urology;  Laterality: N/A;   TRANSURETHRAL RESECTION OF BLADDER TUMOR WITH GYRUS (TURBT-GYRUS) N/A 07/12/2014   Procedure: TRANSURETHRAL RESECTION OF BLADDER TUMOR WITH GYRUS (TURBT-GYRUS);  Surgeon: Ailene Rud, MD;  Location: [facility];  Service: Urology;  Laterality: N/A;   WISDOM TEETH EXTRACTED  10/19/2003   oral surgeon office- bp dropped- but recovered  Family History  Problem Relation Age of Onset   Alcohol abuse Mother    Cancer Father 50       lung cancer   Alcohol abuse Father    Hyperlipidemia Daughter     Social History   Socioeconomic History   Marital status: Married    Spouse name: Not on file   Number of children: Not on file   Years of education: Not on file   Highest education level: Not on file  Occupational History   Not on file  Tobacco Use   Smoking status: Never   Smokeless tobacco: Never  Vaping Use   Vaping Use: Never used  Substance and Sexual  Activity   Alcohol use: Yes    Alcohol/week: 14.0 standard drinks    Types: 14 Cans of beer per week    Comment: COUPLE  BEERS A DAY   Drug use: No   Sexual activity: Not on file  Other Topics Concern   Not on file  Social History Narrative   Not on file   Social Determinants of Health   Financial Resource Strain: Low Risk    Difficulty of Paying Living Expenses: Not hard at all  Food Insecurity: No Food Insecurity   Worried About Charity fundraiser in the Last Year: Never true   Ran Out of Food in the Last Year: Never true  Transportation Needs: No Transportation Needs   Lack of Transportation (Medical): No   Lack of Transportation (Non-Medical): No  Physical Activity: Insufficiently Active   Days of Exercise per Week: 3 days   Minutes of Exercise per Session: 30 min  Stress: No Stress Concern Present   Feeling of Stress : Not at all  Social Connections: Moderately Integrated   Frequency of Communication with Friends and Family: Three times a week   Frequency of Social Gatherings with Friends and Family: Three times a week   Attends Religious Services: Never   Active Member of Clubs or Organizations: Yes   Attends Music therapist: More than 4 times per year   Marital Status: Married  Human resources officer Violence: Not At Risk   Fear of Current or Ex-Partner: No   Emotionally Abused: No   Physically Abused: No   Sexually Abused: No    Outpatient Medications Prior to Visit  Medication Sig Dispense Refill   albuterol (VENTOLIN HFA) 108 (90 Base) MCG/ACT inhaler Inhale 2 puffs into the lungs every 4 (four) hours as needed for wheezing or shortness of breath. 8 g 0   amoxicillin-clavulanate (AUGMENTIN) 875-125 MG tablet Take 1 tablet by mouth 2 (two) times daily. 20 tablet 0   indapamide (LOZOL) 2.5 MG tablet Take 2.5 mg by mouth daily.     loratadine (CLARITIN) 10 MG tablet Take 10 mg by mouth daily.     lovastatin (MEVACOR) 20 MG tablet Take 1 tablet (20 mg  total) by mouth every evening. 90 tablet 3   Multiple Vitamin (MULTIVITAMIN) tablet Take 1 tablet by mouth daily.     predniSONE (DELTASONE) 20 MG tablet Take two tablets once daily for 5 days 10 tablet 0   No facility-administered medications prior to visit.    Allergies  Allergen Reactions   Hydrocodone Other (See Comments)    HALLUCINATIONS   Lipitor [Atorvastatin] Other (See Comments)    "muscle soreness"   Pravastatin Sodium Other (See Comments)    INCREASED CK   Sulfa Antibiotics Rash    ROS Review of Systems  Constitutional:  Negative for chills and fever.  Respiratory:  Negative for cough and shortness of breath.   Cardiovascular:  Negative for palpitations.  Neurological:  Negative for dizziness and syncope.     Objective:    Physical Exam Vitals reviewed.  Constitutional:      Appearance: Normal appearance.  Cardiovascular:     Rate and Rhythm: Normal rate and regular rhythm.     Heart sounds:    No friction rub. No gallop.  Pulmonary:     Effort: Pulmonary effort is normal.     Breath sounds: Normal breath sounds. No wheezing or rales.  Musculoskeletal:     Cervical back: Neck supple.     Comments: Has some mild chest wall soreness around the fourth rib or so anteriorly.  No visible swelling.  Neurological:     Mental Status: He is alert.    BP 124/80 (BP Location: Left Arm, Patient Position: Sitting, Cuff Size: Normal)    Pulse 80    Temp 97.9 F (36.6 C) (Oral)    Wt 182 lb 1.6 oz (82.6 kg)    SpO2 96%    BMI 26.13 kg/m  Wt Readings from Last 3 Encounters:  11/11/21 182 lb 1.6 oz (82.6 kg)  10/21/21 180 lb 5 oz (81.8 kg)  10/12/21 175 lb (79.4 kg)     Health Maintenance Due  Topic Date Due   Hepatitis C Screening  Never done   Zoster Vaccines- Shingrix (1 of 2) Never done   Pneumonia Vaccine 68+ Years old (47 - PPSV23 if available, else PCV20) 11/15/2014   COVID-19 Vaccine (4 - Booster) 10/21/2020    There are no preventive care reminders to  display for this patient.  No results found for: TSH Lab Results  Component Value Date   WBC 7.0 10/12/2021   HGB 14.6 10/12/2021   HCT 42.6 10/12/2021   MCV 90.6 10/12/2021   PLT 200 10/12/2021   Lab Results  Component Value Date   NA 130 (L) 10/12/2021   K 3.4 (L) 10/12/2021   CO2 27 10/12/2021   GLUCOSE 89 10/12/2021   BUN 11 10/12/2021   CREATININE 0.78 10/12/2021   BILITOT 0.7 04/13/2021   ALKPHOS 50 04/13/2021   AST 23 04/13/2021   ALT 32 04/13/2021   PROT 6.6 04/13/2021   ALBUMIN 4.4 04/13/2021   CALCIUM 9.7 10/12/2021   ANIONGAP 6 10/12/2021   GFR 83.35 04/13/2021   Lab Results  Component Value Date   CHOL 202 (H) 04/13/2021   Lab Results  Component Value Date   HDL 58.10 04/13/2021   Lab Results  Component Value Date   LDLCALC 111 (H) 04/13/2021   Lab Results  Component Value Date   TRIG 162.0 (H) 04/13/2021   Lab Results  Component Value Date   CHOLHDL 3 04/13/2021   No results found for: HGBA1C    Assessment & Plan:   Problem List Items Addressed This Visit   None Visit Diagnoses     Left-sided chest wall pain    -  Primary   Relevant Orders   DG Ribs Unilateral Left     Patient has some persistent soreness following recent respiratory illness and syncopal episode where he fell forward.  May have hit his chest at the time but we are not sure.  There was no visible bruising exteriorly after the fall.  Suspect this all represents contusion.  Cannot rule out rib fracture.  -We discussed getting left rib films even though this would  not change management likely. -He will continue with short-term cautious use of Advil and heat for symptomatic relief. -Touch base if soreness not improving over the next month and sooner as needed  No orders of the defined types were placed in this encounter.   Follow-up: No follow-ups on file.    Carolann Littler, MD

## 2021-11-11 NOTE — Patient Instructions (Signed)
Continue with the heat and Advil as needed  Suspect musculoskeletal pain.    Will call with X-ray result.

## 2021-11-23 ENCOUNTER — Telehealth: Payer: Self-pay | Admitting: Family Medicine

## 2021-11-23 ENCOUNTER — Ambulatory Visit (INDEPENDENT_AMBULATORY_CARE_PROVIDER_SITE_OTHER): Payer: Medicare Other | Admitting: Family Medicine

## 2021-11-23 VITALS — BP 140/80 | HR 90 | Temp 98.2°F | Wt 179.6 lb

## 2021-11-23 DIAGNOSIS — R051 Acute cough: Secondary | ICD-10-CM | POA: Diagnosis not present

## 2021-11-23 DIAGNOSIS — J040 Acute laryngitis: Secondary | ICD-10-CM

## 2021-11-23 MED ORDER — CEFDINIR 300 MG PO CAPS: 300.0000 mg | ORAL_CAPSULE | Freq: Two times a day (BID) | ORAL | 0 refills | Status: DC

## 2021-11-23 MED ORDER — ONDANSETRON 4 MG PO TBDP: 4.0000 mg | ORAL_TABLET | Freq: Three times a day (TID) | ORAL | 0 refills | Status: DC | PRN

## 2021-11-23 NOTE — Patient Instructions (Signed)
Please let me know if not feeling better in next few days  Let me know if fever persisting into later week

## 2021-11-23 NOTE — Telephone Encounter (Signed)
Patient calling in with respiratory symptoms: Shortness of breath, chest pain, palpitations or other red words send to Triage  Does the patient have a fever over 100, cough, congestion, sore throat, runny nose, lost of taste/smell (please list symptoms that patient has)? Cough, fatigue, nausea   What date did symptoms start? 1/30 (If over 5 days ago, pt may be scheduled for in person visit)  Have you tested for Covid in the last 5 days? Yes   If yes, was it positive []  OR negative [x] ? If positive in the last 5 days, please schedule virtual visit now. If negative, schedule for an in person OV with the next available provider if PCP has no openings. Please also let patient know they will be tested again (follow the script below)  "you will have to arrive 3mins prior to your appt time to be Covid tested. Please park in back of office at the cone & call [phone] to let the staff know you have arrived. A staff member will meet you at your car to do a rapid covid test. Once the test has resulted you will be notified by phone of your results to determine if appt will remain an in person visit or be converted to a virtual/phone visit. If you arrive less than 30mins before your appt time, your visit will be automatically converted to virtual & any recommended testing will happen AFTER the visit."   [place]  If no availability for virtual visit in office,  please schedule another [place] office  If no availability at another [place] office, please instruct patient that they can schedule an evisit or virtual visit through their mychart account. Visits up to 8pm  patients can be seen in office 5 days after positive COVID test

## 2021-11-23 NOTE — Progress Notes (Signed)
Established Patient Office Visit  Subjective:  Patient ID: Jeffrey Horn, male    DOB: Apr 12, 1946  Age: 76 y.o. MRN: [id number]  CC:  Chief Complaint  Patient presents with   Cough    Productive cough, green mucus, lost voice, sore throat    HPI Jeffrey Horn presents for recurrent respiratory symptoms.  He had respiratory illness back in December and eventually was treated with prednisone and antibiotics and improved and felt like he was over that.  He feels like this is a separate illness with onset around last Monday.  He feels like he has gotten progressively worse through the last week.  He particularly felt poorly over the weekend.  Severe cough at times.  He has cough productive of green mucus.  He folic he may have had some low-grade fever over the weekend.  Some sore throat.  1 day history of laryngitis symptoms.  Home COVID test negative.  No dyspnea at rest.  Felt little queasy 1 day but no vomiting.  No diarrhea.  No sick contacts.  Past Medical History:  Diagnosis Date   Allergy    BPH (benign prostatic hypertrophy)    Chicken pox    GERD (gastroesophageal reflux disease)    History of basal cell carcinoma excision    elbow   History of kidney stones    History of skin cancer    EXCISION MALIGNANT NEOPLASM   Hyperlipidemia    Hypogonadism male    Nocturia    Prostate nodule    Recurrent bladder papillary carcinoma ([place]) MONTIORED BY DR Gaynelle Arabian and oncologist-  dr Alen Blew   First dx Dec 2014--  S/P  TURBT's  and BCG and Mitomyocin C tx's   Renal calculus, bilateral    Renal cyst, acquired     Past Surgical History:  Procedure Laterality Date   CYSTOSCOPY W/ RETROGRADES Bilateral 10/30/2015   Procedure: CYSTOSCOPY WITH RETROGRADE PYELOGRAM;  Surgeon: Carolan Clines, MD;  Location: [place];  Service: Urology;  Laterality: Bilateral;   HEMORRHOID BANDING     MD OFFICE   LAPAROSCOPIC CHOLECYSTECTOMY  02/24/2009   LASIK Bilateral  06/18/1989   TONSILLECTOMY  as child   TONSILLECTOMY  2017   TRANSURETHRAL RESECTION OF BLADDER TUMOR N/A 12/03/2013   Procedure: TRANSURETHRAL RESECTION OF BLADDER TUMOR (TURBT)AT BLADDER NECK 8CM, TRANSURETHRAL RESECTION OF PROSTATE;  Surgeon: Ailene Rud, MD;  Location: [facility];  Service: Urology;  Laterality: N/A;   TRANSURETHRAL RESECTION OF BLADDER TUMOR N/A 11/01/2014   Procedure: TRANSURETHRAL RESECTION OF BLADDER TUMOR (TURBT);  Surgeon: Ailene Rud, MD;  Location: [facility];  Service: Urology;  Laterality: N/A;   TRANSURETHRAL RESECTION OF BLADDER TUMOR N/A 10/30/2015   Procedure: TRANSURETHRAL RESECTION OF BLADDER TUMOR X2 (TURBT) WITH TAUBER FORCEPS;  Surgeon: Carolan Clines, MD;  Location: [place];  Service: Urology;  Laterality: N/A;   TRANSURETHRAL RESECTION OF BLADDER TUMOR WITH GYRUS (TURBT-GYRUS) N/A 07/12/2014   Procedure: TRANSURETHRAL RESECTION OF BLADDER TUMOR WITH GYRUS (TURBT-GYRUS);  Surgeon: Ailene Rud, MD;  Location: [facility];  Service: Urology;  Laterality: N/A;   WISDOM TEETH EXTRACTED  10/19/2003   oral surgeon office- bp dropped- but recovered    Family History  Problem Relation Age of Onset   Alcohol abuse Mother    Cancer Father 46       lung cancer   Alcohol abuse Father    Hyperlipidemia Daughter     Social History  Socioeconomic History   Marital status: Married    Spouse name: Not on file   Number of children: Not on file   Years of education: Not on file   Highest education level: Bachelor's degree (e.g., BA, AB, BS)  Occupational History   Not on file  Tobacco Use   Smoking status: Never   Smokeless tobacco: Never  Vaping Use   Vaping Use: Never used  Substance and Sexual Activity   Alcohol use: Yes    Alcohol/week: 14.0 standard drinks    Types: 14 Cans of beer per week    Comment: COUPLE  BEERS A DAY   Drug use: No   Sexual activity: Not on file   Other Topics Concern   Not on file  Social History Narrative   Not on file   Social Determinants of Health   Financial Resource Strain: Low Risk    Difficulty of Paying Living Expenses: Not hard at all  Food Insecurity: No Food Insecurity   Worried About Charity fundraiser in the Last Year: Never true   Ran Out of Food in the Last Year: Never true  Transportation Needs: No Transportation Needs   Lack of Transportation (Medical): No   Lack of Transportation (Non-Medical): No  Physical Activity: Insufficiently Active   Days of Exercise per Week: 2 days   Minutes of Exercise per Session: 50 min  Stress: No Stress Concern Present   Feeling of Stress : Not at all  Social Connections: Socially Integrated   Frequency of Communication with Friends and Family: Twice a week   Frequency of Social Gatherings with Friends and Family: More than three times a week   Attends Religious Services: 1 to 4 times per year   Active Member of [employer] or Organizations: Yes   Attends Archivist Meetings: Never   Marital Status: Married  Human resources officer Violence: Not At Risk   Fear of Current or Ex-Partner: No   Emotionally Abused: No   Physically Abused: No   Sexually Abused: No    Outpatient Medications Prior to Visit  Medication Sig Dispense Refill   lovastatin (MEVACOR) 20 MG tablet Take 1 tablet (20 mg total) by mouth every evening. 90 tablet 3   Multiple Vitamin (MULTIVITAMIN) tablet Take 1 tablet by mouth daily.     albuterol (VENTOLIN HFA) 108 (90 Base) MCG/ACT inhaler Inhale 2 puffs into the lungs every 4 (four) hours as needed for wheezing or shortness of breath. 8 g 0   amoxicillin-clavulanate (AUGMENTIN) 875-125 MG tablet Take 1 tablet by mouth 2 (two) times daily. 20 tablet 0   indapamide (LOZOL) 2.5 MG tablet Take 2.5 mg by mouth daily.     loratadine (CLARITIN) 10 MG tablet Take 10 mg by mouth daily.     predniSONE (DELTASONE) 20 MG tablet Take two tablets once daily for 5  days 10 tablet 0   No facility-administered medications prior to visit.    Allergies  Allergen Reactions   Hydrocodone Other (See Comments)    HALLUCINATIONS   Lipitor [Atorvastatin] Other (See Comments)    "muscle soreness"   Pravastatin Sodium Other (See Comments)    INCREASED CK   Sulfa Antibiotics Rash    ROS Review of Systems  Constitutional:  Positive for fatigue.  HENT:  Positive for sore throat and voice change.   Respiratory:  Positive for cough. Negative for wheezing.   Cardiovascular:  Negative for chest pain.  Gastrointestinal:  Negative for abdominal pain.  Genitourinary:  Negative for dysuria.  Neurological:  Negative for syncope.     Objective:    Physical Exam Vitals reviewed.  Constitutional:      Appearance: Normal appearance.  HENT:     Right Ear: Tympanic membrane normal.     Left Ear: Tympanic membrane normal.  Cardiovascular:     Rate and Rhythm: Normal rate and regular rhythm.  Pulmonary:     Effort: Pulmonary effort is normal.     Breath sounds: No wheezing.     Comments: He has a few rhonchi right base.  None noted on the left.  Good air movement throughout. Musculoskeletal:     Cervical back: Neck supple.  Neurological:     Mental Status: He is alert.    BP 140/80 (BP Location: Left Arm, Patient Position: Sitting, Cuff Size: Normal)    Pulse 90    Temp 98.2 F (36.8 C) (Oral)    Wt 179 lb 9.6 oz (81.5 kg)    SpO2 92%    BMI 25.77 kg/m  Wt Readings from Last 3 Encounters:  11/23/21 179 lb 9.6 oz (81.5 kg)  11/11/21 182 lb 1.6 oz (82.6 kg)  10/21/21 180 lb 5 oz (81.8 kg)     Health Maintenance Due  Topic Date Due   Hepatitis C Screening  Never done   Zoster Vaccines- Shingrix (1 of 2) Never done   Pneumonia Vaccine 55+ Years old (6 - PPSV23 if available, else PCV20) 11/15/2014   COVID-19 Vaccine (4 - Booster) 10/21/2020    There are no preventive care reminders to display for this patient.  No results found for: TSH Lab  Results  Component Value Date   WBC 7.0 10/12/2021   HGB 14.6 10/12/2021   HCT 42.6 10/12/2021   MCV 90.6 10/12/2021   PLT 200 10/12/2021   Lab Results  Component Value Date   NA 130 (L) 10/12/2021   K 3.4 (L) 10/12/2021   CO2 27 10/12/2021   GLUCOSE 89 10/12/2021   BUN 11 10/12/2021   CREATININE 0.78 10/12/2021   BILITOT 0.7 04/13/2021   ALKPHOS 50 04/13/2021   AST 23 04/13/2021   ALT 32 04/13/2021   PROT 6.6 04/13/2021   ALBUMIN 4.4 04/13/2021   CALCIUM 9.7 10/12/2021   ANIONGAP 6 10/12/2021   GFR 83.35 04/13/2021   Lab Results  Component Value Date   CHOL 202 (H) 04/13/2021   Lab Results  Component Value Date   HDL 58.10 04/13/2021   Lab Results  Component Value Date   LDLCALC 111 (H) 04/13/2021   Lab Results  Component Value Date   TRIG 162.0 (H) 04/13/2021   Lab Results  Component Value Date   CHOLHDL 3 04/13/2021   No results found for: HGBA1C    Assessment & Plan:   Acute respiratory illness.  Patient presents with productive cough with worsening symptoms over the past week following fairly severe respiratory infection back in December and January.  He feels like this is a separate illness.  Home COVID test negative.  Does have some rhonchi right base.  No respiratory distress.  -At his age and recent respiratory illness we decided to go and cover with Omnicef 300 mg twice daily for 7 days. -Prescription for Zofran 4 mg ODT to use 1 every 8 hours as needed for nausea -Follow-up immediately for any shortness of breath or any persistent or worsening symptoms -We recommended chest x-ray and further evaluation if any worsening symptoms next few days  Meds ordered  this encounter  Medications   cefdinir (OMNICEF) 300 MG capsule    Sig: Take 1 capsule (300 mg total) by mouth 2 (two) times daily.    Dispense:  14 capsule    Refill:  0   ondansetron (ZOFRAN-ODT) 4 MG disintegrating tablet    Sig: Take 1 tablet (4 mg total) by mouth every 8 (eight) hours  as needed for nausea or vomiting.    Dispense:  12 tablet    Refill:  0    Follow-up: No follow-ups on file.    Carolann Littler, MD

## 2021-12-14 DIAGNOSIS — S60511A Abrasion of right hand, initial encounter: Secondary | ICD-10-CM | POA: Diagnosis not present

## 2021-12-14 DIAGNOSIS — Z85828 Personal history of other malignant neoplasm of skin: Secondary | ICD-10-CM | POA: Diagnosis not present

## 2022-02-25 DIAGNOSIS — R82994 Hypercalciuria: Secondary | ICD-10-CM | POA: Diagnosis not present

## 2022-02-25 DIAGNOSIS — N200 Calculus of kidney: Secondary | ICD-10-CM | POA: Diagnosis not present

## 2022-02-25 DIAGNOSIS — Z8551 Personal history of malignant neoplasm of bladder: Secondary | ICD-10-CM | POA: Diagnosis not present

## 2022-02-25 DIAGNOSIS — N401 Enlarged prostate with lower urinary tract symptoms: Secondary | ICD-10-CM | POA: Diagnosis not present

## 2022-02-25 DIAGNOSIS — N138 Other obstructive and reflux uropathy: Secondary | ICD-10-CM | POA: Diagnosis not present

## 2022-04-14 ENCOUNTER — Encounter: Payer: Medicare Other | Admitting: Family Medicine

## 2022-05-05 ENCOUNTER — Ambulatory Visit (INDEPENDENT_AMBULATORY_CARE_PROVIDER_SITE_OTHER): Payer: Medicare Other | Admitting: Family Medicine

## 2022-05-05 ENCOUNTER — Encounter: Payer: Self-pay | Admitting: Family Medicine

## 2022-05-05 VITALS — BP 118/80 | HR 84 | Temp 97.6°F | Ht 69.69 in | Wt 176.2 lb

## 2022-05-05 DIAGNOSIS — E785 Hyperlipidemia, unspecified: Secondary | ICD-10-CM

## 2022-05-05 DIAGNOSIS — Z0000 Encounter for general adult medical examination without abnormal findings: Secondary | ICD-10-CM

## 2022-05-05 LAB — LIPID PANEL
Cholesterol: 177 mg/dL (ref 0–200)
HDL: 63.5 mg/dL (ref >39.00)
LDL Cholesterol: 95 mg/dL (ref 0–99)
NonHDL: 113.58
Total CHOL/HDL Ratio: 3
Triglycerides: 93 mg/dL (ref 0.0–149.0)
VLDL: 18.6 mg/dL (ref 0.0–40.0)

## 2022-05-05 LAB — BASIC METABOLIC PANEL
BUN: 15 mg/dL (ref 6–23)
CO2: 27 mEq/L (ref 19–32)
Calcium: 10.3 mg/dL (ref 8.4–10.5)
Chloride: 103 mEq/L (ref 96–112)
Creatinine, Ser: 0.89 mg/dL (ref 0.40–1.50)
GFR: 82.73 mL/min (ref >60.00)
Glucose, Bld: 94 mg/dL (ref 70–99)
Potassium: 4.7 mEq/L (ref 3.5–5.1)
Sodium: 136 mEq/L (ref 135–145)

## 2022-05-05 LAB — HEPATIC FUNCTION PANEL
ALT: 24 U/L (ref 0–53)
AST: 26 U/L (ref 0–37)
Albumin: 4.4 g/dL (ref 3.5–5.2)
Alkaline Phosphatase: 63 U/L (ref 39–117)
Bilirubin, Direct: 0.2 mg/dL (ref 0.0–0.3)
Total Bilirubin: 0.9 mg/dL (ref 0.2–1.2)
Total Protein: 6.9 g/dL (ref 6.0–8.3)

## 2022-05-05 NOTE — Progress Notes (Signed)
Established Patient Office Visit  Subjective   Patient ID: Jeffrey Horn, male    DOB: 1946-07-07  Age: 76 y.o. MRN: [id number]  Chief Complaint  Patient presents with   Annual Exam    HPI   Jeffrey Horn is here for Medicare subsequent annual wellness visit and medical follow-up.  He has hyperlipidemia and takes Mevacor 20 mg daily for that.  He has history of bladder cancer and is followed regularly by urologist.  Generally doing well.  He walks about 3 miles most days per week.  Tolerating Mevacor with no significant side effects.  Due for follow-up labs.  Health maintenance reviewed  -Gets annual flu vaccine -Tetanus due 2027 -Declines hepatitis C testing.  Low risk. -Has had both previous shingles vaccines -Pneumonia vaccines complete  1.  Risk factors based on Past Medical , Social, and Family history reviewed and as indicated above with no changes  2.  Limitations in physical activities None.  No recent falls.  Did have 1 fall last winter when he was sick with an illness.  No issues since then.  Good balance.   3.  Depression/mood No active depression or anxiety issues PHQ 2 equals 0  4.  Hearing -he has bilateral hearing aids.  He has been followed by audiologist.   5.  ADLs independent in all.  6.  Cognitive function (orientation to time and place, language, writing, speech,memory) no short or long term memory issues.  Language and judgement intact.  No issues with short-term memory.  7.  Home Safety no issues  8.  Height, weight, and visual acuity.all stable. -He sees ophthalmologist yearly for screening.  Wt Readings from Last 3 Encounters:  05/05/22 176 lb 3.2 oz (79.9 kg)  11/23/21 179 lb 9.6 oz (81.5 kg)  11/11/21 182 lb 1.6 oz (82.6 kg)    9.  Counseling discussed Counseled regarding age and gender appropriate preventative screenings and immunizations.  10. Recommendation of preventive services. -Recommend annual flu vaccine recommend annual flu  vaccine -Aged out of colonoscopy -We discussed PSA screening and recommend against given his age  76. Labs based on risk factors-lipid, hepatic, basic metabolic panel  12. Care Plan-as below  13. Other Providers-Dr. Lawerance Bach, urology  14. Written schedule of screening/prevention services given to patient.  Health Maintenance  Topic Date Due   COVID-19 Vaccine (4 - Booster) 10/21/2020   Hepatitis C Screening  05/06/2023 (Originally 12/13/1963)   INFLUENZA VACCINE  05/18/2022   TETANUS/TDAP  05/20/2026   Zoster Vaccines- Shingrix  Completed   HPV VACCINES  Aged Out   Pneumonia Vaccine 39+ Years old  Discontinued   Advance directives-has trust set up.  No living will.  Review of Systems  Constitutional:  Negative for malaise/fatigue.  Eyes:  Negative for blurred vision.  Respiratory:  Negative for shortness of breath.   Cardiovascular:  Negative for chest pain.  Gastrointestinal:  Negative for abdominal pain.  Genitourinary:  Negative for dysuria.       Does have some urine urgency.  Occasional mild leakage.  Neurological:  Negative for dizziness, weakness and headaches.      Objective:     BP 118/80 (BP Location: Left Arm, Patient Position: Sitting, Cuff Size: Normal)   Pulse 84   Temp 97.6 F (36.4 C) (Oral)   Ht 5' 9.69" (1.77 m)   Wt 176 lb 3.2 oz (79.9 kg)   SpO2 97%   BMI 25.51 kg/m  BP Readings from Last 3 Encounters:  05/05/22  118/80  11/23/21 140/80  11/11/21 124/80   Wt Readings from Last 3 Encounters:  05/05/22 176 lb 3.2 oz (79.9 kg)  11/23/21 179 lb 9.6 oz (81.5 kg)  11/11/21 182 lb 1.6 oz (82.6 kg)      Physical Exam Constitutional:      General: He is not in acute distress.    Appearance: He is well-developed.  HENT:     Head: Normocephalic and atraumatic.     Right Ear: External ear normal.     Left Ear: External ear normal.  Eyes:     Conjunctiva/sclera: Conjunctivae normal.     Pupils: Pupils are equal, round, and reactive to light.   Neck:     Thyroid: No thyromegaly.  Cardiovascular:     Rate and Rhythm: Normal rate and regular rhythm.     Heart sounds: Normal heart sounds. No murmur heard. Pulmonary:     Effort: No respiratory distress.     Breath sounds: No wheezing or rales.  Abdominal:     General: Bowel sounds are normal. There is no distension.     Palpations: Abdomen is soft. There is no mass.     Tenderness: There is no abdominal tenderness. There is no guarding or rebound.  Musculoskeletal:     Cervical back: Normal range of motion and neck supple.     Right lower leg: No edema.     Left lower leg: No edema.  Lymphadenopathy:     Cervical: No cervical adenopathy.  Skin:    Findings: No rash.  Neurological:     Mental Status: He is alert and oriented to person, place, and time.     Cranial Nerves: No cranial nerve deficit.      No results found for any visits on 05/05/22.  Last metabolic panel Lab Results  Component Value Date   GLUCOSE 89 10/12/2021   NA 130 (L) 10/12/2021   K 3.4 (L) 10/12/2021   CL 97 (L) 10/12/2021   CO2 27 10/12/2021   BUN 11 10/12/2021   CREATININE 0.78 10/12/2021   GFRNONAA >60 10/12/2021   CALCIUM 9.7 10/12/2021   PROT 6.6 04/13/2021   ALBUMIN 4.4 04/13/2021   BILITOT 0.7 04/13/2021   ALKPHOS 50 04/13/2021   AST 23 04/13/2021   ALT 32 04/13/2021   ANIONGAP 6 10/12/2021   Last lipids Lab Results  Component Value Date   CHOL 202 (H) 04/13/2021   HDL 58.10 04/13/2021   LDLCALC 111 (H) 04/13/2021   TRIG 162.0 (H) 04/13/2021   CHOLHDL 3 04/13/2021      The 10-year ASCVD risk score (Arnett DK, et al., 2019) is: 23.7%    Assessment & Plan:   #1 Medicare subsequent annual wellness visit.  He is up-to-date regarding most health maintenance.  Do recommend annual flu vaccine.  We discussed pros and cons of PSA testing and he is comfortable not screening further at this time.  Continue annual eye checkup.  Continue regular exercise habits.  Shingles vaccine  and pneumonia vaccines complete.  #2 hyperlipidemia.  Continue Mevacor 20 mg daily.  Check lipid and hepatic panel.  Continue low saturated fat diet.   No follow-ups on file.    Carolann Littler, MD

## 2022-06-14 DIAGNOSIS — L812 Freckles: Secondary | ICD-10-CM | POA: Diagnosis not present

## 2022-06-14 DIAGNOSIS — Z85828 Personal history of other malignant neoplasm of skin: Secondary | ICD-10-CM | POA: Diagnosis not present

## 2022-06-14 DIAGNOSIS — L821 Other seborrheic keratosis: Secondary | ICD-10-CM | POA: Diagnosis not present

## 2022-07-05 ENCOUNTER — Other Ambulatory Visit: Payer: Self-pay | Admitting: Family Medicine

## 2022-07-09 DIAGNOSIS — Z23 Encounter for immunization: Secondary | ICD-10-CM | POA: Diagnosis not present

## 2022-07-20 DIAGNOSIS — Z23 Encounter for immunization: Secondary | ICD-10-CM | POA: Diagnosis not present

## 2022-09-07 DIAGNOSIS — H25813 Combined forms of age-related cataract, bilateral: Secondary | ICD-10-CM | POA: Diagnosis not present

## 2022-09-23 ENCOUNTER — Other Ambulatory Visit: Payer: Self-pay | Admitting: Family Medicine

## 2022-10-28 DIAGNOSIS — C672 Malignant neoplasm of lateral wall of bladder: Secondary | ICD-10-CM | POA: Diagnosis not present

## 2023-02-03 DIAGNOSIS — N200 Calculus of kidney: Secondary | ICD-10-CM | POA: Diagnosis not present

## 2023-02-03 DIAGNOSIS — Z8551 Personal history of malignant neoplasm of bladder: Secondary | ICD-10-CM | POA: Diagnosis not present

## 2023-02-03 DIAGNOSIS — N401 Enlarged prostate with lower urinary tract symptoms: Secondary | ICD-10-CM | POA: Diagnosis not present

## 2023-02-03 DIAGNOSIS — N138 Other obstructive and reflux uropathy: Secondary | ICD-10-CM | POA: Diagnosis not present

## 2023-02-03 DIAGNOSIS — Z08 Encounter for follow-up examination after completed treatment for malignant neoplasm: Secondary | ICD-10-CM | POA: Diagnosis not present

## 2023-02-03 DIAGNOSIS — N3289 Other specified disorders of bladder: Secondary | ICD-10-CM | POA: Diagnosis not present

## 2023-02-14 DIAGNOSIS — L821 Other seborrheic keratosis: Secondary | ICD-10-CM | POA: Diagnosis not present

## 2023-02-14 DIAGNOSIS — D485 Neoplasm of uncertain behavior of skin: Secondary | ICD-10-CM | POA: Diagnosis not present

## 2023-02-14 DIAGNOSIS — L820 Inflamed seborrheic keratosis: Secondary | ICD-10-CM | POA: Diagnosis not present

## 2023-02-14 DIAGNOSIS — D0462 Carcinoma in situ of skin of left upper limb, including shoulder: Secondary | ICD-10-CM | POA: Diagnosis not present

## 2023-04-02 ENCOUNTER — Other Ambulatory Visit: Payer: Self-pay | Admitting: Family Medicine

## 2023-04-25 DIAGNOSIS — L820 Inflamed seborrheic keratosis: Secondary | ICD-10-CM | POA: Diagnosis not present

## 2023-04-25 DIAGNOSIS — D0439 Carcinoma in situ of skin of other parts of face: Secondary | ICD-10-CM | POA: Diagnosis not present

## 2023-04-25 DIAGNOSIS — L570 Actinic keratosis: Secondary | ICD-10-CM | POA: Diagnosis not present

## 2023-04-25 DIAGNOSIS — D485 Neoplasm of uncertain behavior of skin: Secondary | ICD-10-CM | POA: Diagnosis not present

## 2023-05-09 ENCOUNTER — Ambulatory Visit (INDEPENDENT_AMBULATORY_CARE_PROVIDER_SITE_OTHER): Payer: Medicare Other | Admitting: Family Medicine

## 2023-05-09 VITALS — BP 110/70 | HR 81 | Temp 97.8°F | Ht 70.67 in | Wt 174.9 lb

## 2023-05-09 DIAGNOSIS — R202 Paresthesia of skin: Secondary | ICD-10-CM

## 2023-05-09 DIAGNOSIS — E785 Hyperlipidemia, unspecified: Secondary | ICD-10-CM | POA: Diagnosis not present

## 2023-05-09 DIAGNOSIS — Z0000 Encounter for general adult medical examination without abnormal findings: Secondary | ICD-10-CM

## 2023-05-09 DIAGNOSIS — Z23 Encounter for immunization: Secondary | ICD-10-CM

## 2023-05-09 LAB — BASIC METABOLIC PANEL
BUN: 16 mg/dL (ref 6–23)
CO2: 25 mEq/L (ref 19–32)
Calcium: 10.1 mg/dL (ref 8.4–10.5)
Chloride: 105 mEq/L (ref 96–112)
Creatinine, Ser: 0.91 mg/dL (ref 0.40–1.50)
GFR: 80.79 mL/min (ref >60.00)
Glucose, Bld: 94 mg/dL (ref 70–99)
Potassium: 4.6 mEq/L (ref 3.5–5.1)
Sodium: 138 mEq/L (ref 135–145)

## 2023-05-09 LAB — VITAMIN B12: Vitamin B-12: 275 pg/mL (ref 211–911)

## 2023-05-09 LAB — HEPATIC FUNCTION PANEL
ALT: 25 U/L (ref 0–53)
AST: 27 U/L (ref 0–37)
Albumin: 4.1 g/dL (ref 3.5–5.2)
Alkaline Phosphatase: 63 U/L (ref 39–117)
Bilirubin, Direct: 0.2 mg/dL (ref 0.0–0.3)
Total Bilirubin: 0.8 mg/dL (ref 0.2–1.2)
Total Protein: 6.6 g/dL (ref 6.0–8.3)

## 2023-05-09 LAB — LIPID PANEL
Cholesterol: 173 mg/dL (ref 0–200)
HDL: 56.7 mg/dL (ref >39.00)
LDL Cholesterol: 99 mg/dL (ref 0–99)
NonHDL: 116.28
Total CHOL/HDL Ratio: 3
Triglycerides: 86 mg/dL (ref 0.0–149.0)
VLDL: 17.2 mg/dL (ref 0.0–40.0)

## 2023-05-09 LAB — TSH: TSH: 2.56 u[IU]/mL (ref 0.35–5.50)

## 2023-05-09 NOTE — Progress Notes (Signed)
Established Patient Office Visit  Subjective   Patient ID: Jeffrey Horn, male    DOB: [birth year]  Age: 77 y.o. MRN: [id number]  Chief Complaint  Patient presents with   Annual Exam    HPI   Jeffrey Horn is here for Medicare annual wellness visit and medical follow-up.  He has hyperlipidemia treated with lovastatin and needs follow-up labs.  Denies any side effects from medication.  He has history of bladder cancer and is followed closely by urology.  Has scheduled cystoscopy next month.  Also has history of kidney stones.  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  This is very mild and not impairing balance or ambulation at this time.  He does take multivitamin supplement.  No history of known B12 deficiency.  No history of diabetes.  Usually drinks 2 beers per day.  No upper extremity paresthesias.  Past Medical History:  Diagnosis Date   Allergy    BPH (benign prostatic hypertrophy)    Chicken pox    GERD (gastroesophageal reflux disease)    History of basal cell carcinoma excision    elbow   History of kidney stones    History of skin cancer    EXCISION MALIGNANT NEOPLASM   Hyperlipidemia    Hypogonadism male    Nocturia    Prostate nodule    Recurrent bladder papillary carcinoma (HCC) MONTIORED BY DR Jeffrey Horn and oncologist-  dr Jeffrey Horn   First dx Dec 2014--  S/P  TURBT's  and BCG and Mitomyocin C tx's   Renal calculus, bilateral    Renal cyst, acquired    Past Surgical History:  Procedure Laterality Date   CYSTOSCOPY W/ RETROGRADES Bilateral 10/30/2015   Procedure: CYSTOSCOPY WITH RETROGRADE PYELOGRAM;  Surgeon: Jeffrey Bolus, MD;  Location: [facility] [place];  Service: Urology;  Laterality: Bilateral;   HEMORRHOID BANDING     MD OFFICE   LAPAROSCOPIC  CHOLECYSTECTOMY  02/24/2009   LASIK Bilateral 06/18/1989   TONSILLECTOMY  as child   TONSILLECTOMY  2017   TRANSURETHRAL RESECTION OF BLADDER TUMOR N/A 12/03/2013   Procedure: TRANSURETHRAL RESECTION OF BLADDER TUMOR (TURBT)AT BLADDER NECK 8CM, TRANSURETHRAL RESECTION OF PROSTATE;  Surgeon: Jeffrey Ludwig, MD;  Location: [facility];  Service: Urology;  Laterality: N/A;   TRANSURETHRAL RESECTION OF BLADDER TUMOR N/A 11/01/2014   Procedure: TRANSURETHRAL RESECTION OF BLADDER TUMOR (TURBT);  Surgeon: Jeffrey Ludwig, MD;  Location: [facility] ;  Service: Urology;  Laterality: N/A;   TRANSURETHRAL RESECTION OF BLADDER TUMOR N/A 10/30/2015   Procedure: TRANSURETHRAL RESECTION OF BLADDER TUMOR X2 (TURBT) WITH TAUBER FORCEPS;  Surgeon: Jeffrey Bolus, MD;  Location: [facility];  Service: Urology;  Laterality: N/A;   TRANSURETHRAL RESECTION OF BLADDER TUMOR WITH GYRUS (TURBT-GYRUS) N/A 07/12/2014   Procedure: TRANSURETHRAL RESECTION OF BLADDER TUMOR WITH GYRUS (TURBT-GYRUS);  Surgeon: Jeffrey Ludwig, MD;  Location: [facility];  Service: Urology;  Laterality: N/A;   WISDOM TEETH EXTRACTED  10/19/2003   oral surgeon office- bp dropped- but recovered    reports that he has never smoked. He has never used smokeless tobacco. He reports current alcohol use of about 14.0 standard drinks of alcohol per week. He reports that he does not use drugs. family history includes Alcohol abuse in his father and mother;  Cancer (age of onset: 54) in his father; Hyperlipidemia in his daughter. Allergies  Allergen Reactions   Hydrocodone Other (See Comments)    HALLUCINATIONS   Lipitor [Atorvastatin] Other (See Comments)    "muscle soreness"   Pravastatin Sodium Other (See Comments)    INCREASED CK   Sulfa Antibiotics Rash   1.  Risk factors based on Past Medical , Social, and Family history reviewed and as indicated above with no changes -Retired several  years ago.  Non-smoker.  Usually 2 beers per day. -Family history reviewed with no significant changes -Past medical history as above  2.  Limitations in physical activities None.  No recent falls.  Currently no formal exercise.  Does walk some for exercise when the weather is more conducive.  No recent fall or balance issues  3.  Depression/mood No active depression or anxiety issues PHQ 2 equals 0  4.  Hearing-he has chronic bilateral hearing loss and has hearing aids and is followed regularly by [facility] audiology  5.  ADLs independent in all.  6.  Cognitive function (orientation to time and place, language, writing, speech,memory) no short or long term memory issues.  Language and judgement intact.  7.  Home Safety no issues  8.  Height, weight, and visual acuity.all stable. Wt Readings from Last 3 Encounters:  05/09/23 174 lb 14.4 oz (79.3 kg)  05/05/22 176 lb 3.2 oz (79.9 kg)  11/23/21 179 lb 9.6 oz (81.5 kg)    9.  Counseling discussed -Counseled regarding age and gender appropriate preventative screenings and immunizations.  10. Recommendation of preventive services.  Continue annual flu vaccine.  Pneumonia vaccine completed..  Shingrix already completed  11. Labs based on risk factors-lipid, hepatic, basic metabolic panel, B12, TSH  12. Care Plan-as below  13. Other Providers-Dr. Darvin Horn urologist, [facility] dermatology Dr. Donzetta Horn  14. Written schedule of screening/prevention services given to patient.  Health Maintenance  Topic Date Due   Hepatitis C Screening  Never done   Zoster Vaccines- Shingrix (2 of 2) 04/30/2017   COVID-19 Vaccine (5 - 2023-24 season) 09/14/2022   INFLUENZA VACCINE  05/19/2023   Medicare Annual Wellness (AWV)  05/08/2024   DTaP/Tdap/Td (3 - Td or Tdap) 05/20/2026   HPV VACCINES  Aged Out   Pneumonia Vaccine 1+ Years old  Discontinued   Colonoscopy  Discontinued     Review of Systems  Constitutional:  Negative for  malaise/fatigue.  Eyes:  Negative for blurred vision.  Respiratory:  Negative for shortness of breath.   Cardiovascular:  Negative for chest pain.  Neurological:  Negative for dizziness, weakness and headaches.      Objective:     BP 110/70 (BP Location: Left Arm, Patient Position: Sitting, Cuff Size: Normal)   Pulse 81   Temp 97.8 F (36.6 C) (Oral)   Ht 5' 10.67" (1.795 m)   Wt 174 lb 14.4 oz (79.3 kg)   SpO2 98%   BMI 24.62 kg/m  BP Readings from Last 3 Encounters:  05/09/23 110/70  05/05/22 118/80  11/23/21 140/80   Wt Readings from Last 3 Encounters:  05/09/23 174 lb 14.4 oz (79.3 kg)  05/05/22 176 lb 3.2 oz (79.9 kg)  11/23/21 179 lb 9.6 oz (81.5 kg)      Physical Exam Vitals reviewed.  Constitutional:      General: He is not in acute distress.    Appearance: He is well-developed. He is not ill-appearing, toxic-appearing or diaphoretic.  HENT:  Head: Normocephalic and atraumatic.     Right Ear: External ear normal.     Left Ear: External ear normal.  Eyes:     Conjunctiva/sclera: Conjunctivae normal.     Pupils: Pupils are equal, round, and reactive to light.  Neck:     Thyroid: No thyromegaly.  Cardiovascular:     Rate and Rhythm: Normal rate and regular rhythm.     Heart sounds: Normal heart sounds. No murmur heard. Pulmonary:     Effort: No respiratory distress.     Breath sounds: No wheezing or rales.  Abdominal:     General: Bowel sounds are normal. There is no distension.     Palpations: Abdomen is soft. There is no mass.     Tenderness: There is no abdominal tenderness. There is no guarding or rebound.  Musculoskeletal:     Cervical back: Normal range of motion and neck supple.  Lymphadenopathy:     Cervical: No cervical adenopathy.  Skin:    Comments: Some areas of rash and skin breakdown left forearm from recent treatment with topical 5-fluorouracil  Neurological:     Mental Status: He is alert and oriented to person, place, and time.      Cranial Nerves: No cranial nerve deficit.     Comments: Normal sensory function to touch and monofilament testing both feet      No results found for any visits on 05/09/23.  Last CBC Lab Results  Component Value Date   WBC 7.0 10/12/2021   HGB 14.6 10/12/2021   HCT 42.6 10/12/2021   MCV 90.6 10/12/2021   MCH 31.1 10/12/2021   RDW 12.1 10/12/2021   PLT 200 10/12/2021   Last metabolic panel Lab Results  Component Value Date   GLUCOSE 94 05/09/2023   NA 138 05/09/2023   K 4.6 05/09/2023   CL 105 05/09/2023   CO2 25 05/09/2023   BUN 16 05/09/2023   CREATININE 0.91 05/09/2023   GFR 80.79 05/09/2023   CALCIUM 10.1 05/09/2023   PROT 6.6 05/09/2023   ALBUMIN 4.1 05/09/2023   BILITOT 0.8 05/09/2023   ALKPHOS 63 05/09/2023   AST 27 05/09/2023   ALT 25 05/09/2023   ANIONGAP 6 10/12/2021   Last lipids Lab Results  Component Value Date   CHOL 173 05/09/2023   HDL 56.70 05/09/2023   LDLCALC 99 05/09/2023   TRIG 86.0 05/09/2023   CHOLHDL 3 05/09/2023   Last hemoglobin A1c No results found for: "HGBA1C" Last thyroid functions Lab Results  Component Value Date   TSH 2.56 05/09/2023      The 10-year ASCVD risk score (Arnett DK, et al., 2019) is: 21.5%    Assessment & Plan:   Problem List Items Addressed This Visit       Unprioritized   Hyperlipidemia - Primary   Relevant Orders   Lipid panel   Hepatic function panel   Other Visit Diagnoses     Need for shingles vaccine       Paresthesia       Relevant Orders   TSH   Basic metabolic panel   Vitamin B12   Medicare annual wellness visit, subsequent         -We discussed getting annual flu vaccine. -Check lipid and hepatic panel.  Refill lovastatin for 1 year -Try to establish more consistent exercise  No follow-ups on file.    Evelena Peat, MD

## 2023-06-09 DIAGNOSIS — N3289 Other specified disorders of bladder: Secondary | ICD-10-CM | POA: Diagnosis not present

## 2023-06-09 DIAGNOSIS — N200 Calculus of kidney: Secondary | ICD-10-CM | POA: Diagnosis not present

## 2023-06-09 DIAGNOSIS — C672 Malignant neoplasm of lateral wall of bladder: Secondary | ICD-10-CM | POA: Diagnosis not present

## 2023-06-09 DIAGNOSIS — N400 Enlarged prostate without lower urinary tract symptoms: Secondary | ICD-10-CM | POA: Diagnosis not present

## 2023-07-01 ENCOUNTER — Ambulatory Visit (INDEPENDENT_AMBULATORY_CARE_PROVIDER_SITE_OTHER): Payer: Medicare Other

## 2023-07-01 DIAGNOSIS — Z23 Encounter for immunization: Secondary | ICD-10-CM

## 2023-07-11 DIAGNOSIS — D485 Neoplasm of uncertain behavior of skin: Secondary | ICD-10-CM | POA: Diagnosis not present

## 2023-07-11 DIAGNOSIS — Z85828 Personal history of other malignant neoplasm of skin: Secondary | ICD-10-CM | POA: Diagnosis not present

## 2023-07-11 DIAGNOSIS — D2339 Other benign neoplasm of skin of other parts of face: Secondary | ICD-10-CM | POA: Diagnosis not present

## 2023-07-11 DIAGNOSIS — D2239 Melanocytic nevi of other parts of face: Secondary | ICD-10-CM | POA: Diagnosis not present

## 2023-07-11 DIAGNOSIS — L821 Other seborrheic keratosis: Secondary | ICD-10-CM | POA: Diagnosis not present

## 2023-07-11 DIAGNOSIS — L812 Freckles: Secondary | ICD-10-CM | POA: Diagnosis not present

## 2023-07-26 DIAGNOSIS — C44622 Squamous cell carcinoma of skin of right upper limb, including shoulder: Secondary | ICD-10-CM | POA: Diagnosis not present

## 2023-07-26 DIAGNOSIS — L570 Actinic keratosis: Secondary | ICD-10-CM | POA: Diagnosis not present

## 2023-07-26 DIAGNOSIS — D485 Neoplasm of uncertain behavior of skin: Secondary | ICD-10-CM | POA: Diagnosis not present

## 2023-08-25 DIAGNOSIS — Z23 Encounter for immunization: Secondary | ICD-10-CM | POA: Diagnosis not present

## 2023-09-02 DIAGNOSIS — Z23 Encounter for immunization: Secondary | ICD-10-CM | POA: Diagnosis not present

## 2023-09-05 DIAGNOSIS — C672 Malignant neoplasm of lateral wall of bladder: Secondary | ICD-10-CM | POA: Diagnosis not present

## 2023-09-14 DIAGNOSIS — H43812 Vitreous degeneration, left eye: Secondary | ICD-10-CM | POA: Diagnosis not present

## 2023-09-27 ENCOUNTER — Other Ambulatory Visit: Payer: Self-pay | Admitting: Family Medicine

## 2023-10-24 DIAGNOSIS — L821 Other seborrheic keratosis: Secondary | ICD-10-CM | POA: Diagnosis not present

## 2023-10-24 DIAGNOSIS — Z85828 Personal history of other malignant neoplasm of skin: Secondary | ICD-10-CM | POA: Diagnosis not present

## 2023-12-19 DIAGNOSIS — C672 Malignant neoplasm of lateral wall of bladder: Secondary | ICD-10-CM | POA: Diagnosis not present

## 2024-03-16 ENCOUNTER — Other Ambulatory Visit: Payer: Self-pay | Admitting: Family Medicine

## 2024-03-30 ENCOUNTER — Other Ambulatory Visit: Payer: Self-pay

## 2024-03-30 ENCOUNTER — Emergency Department (HOSPITAL_BASED_OUTPATIENT_CLINIC_OR_DEPARTMENT_OTHER)
Admission: EM | Admit: 2024-03-30 | Discharge: 2024-03-30 | Disposition: A | Discharge status: Home / Self Care | Attending: Emergency Medicine | Admitting: Emergency Medicine

## 2024-03-30 ENCOUNTER — Other Ambulatory Visit (HOSPITAL_BASED_OUTPATIENT_CLINIC_OR_DEPARTMENT_OTHER): Payer: Self-pay

## 2024-03-30 ENCOUNTER — Emergency Department (HOSPITAL_BASED_OUTPATIENT_CLINIC_OR_DEPARTMENT_OTHER)

## 2024-03-30 DIAGNOSIS — Z85828 Personal history of other malignant neoplasm of skin: Secondary | ICD-10-CM | POA: Diagnosis not present

## 2024-03-30 DIAGNOSIS — Z8551 Personal history of malignant neoplasm of bladder: Secondary | ICD-10-CM | POA: Diagnosis not present

## 2024-03-30 DIAGNOSIS — M5416 Radiculopathy, lumbar region: Secondary | ICD-10-CM | POA: Diagnosis not present

## 2024-03-30 DIAGNOSIS — M545 Low back pain, unspecified: Secondary | ICD-10-CM | POA: Diagnosis present

## 2024-03-30 DIAGNOSIS — D1809 Hemangioma of other sites: Secondary | ICD-10-CM | POA: Diagnosis not present

## 2024-03-30 DIAGNOSIS — M47816 Spondylosis without myelopathy or radiculopathy, lumbar region: Secondary | ICD-10-CM | POA: Diagnosis not present

## 2024-03-30 DIAGNOSIS — R109 Unspecified abdominal pain: Secondary | ICD-10-CM | POA: Diagnosis not present

## 2024-03-30 DIAGNOSIS — M48061 Spinal stenosis, lumbar region without neurogenic claudication: Secondary | ICD-10-CM | POA: Diagnosis not present

## 2024-03-30 DIAGNOSIS — K7689 Other specified diseases of liver: Secondary | ICD-10-CM | POA: Diagnosis not present

## 2024-03-30 DIAGNOSIS — K5730 Diverticulosis of large intestine without perforation or abscess without bleeding: Secondary | ICD-10-CM | POA: Diagnosis not present

## 2024-03-30 DIAGNOSIS — K5792 Diverticulitis of intestine, part unspecified, without perforation or abscess without bleeding: Secondary | ICD-10-CM | POA: Diagnosis not present

## 2024-03-30 DIAGNOSIS — M51369 Other intervertebral disc degeneration, lumbar region without mention of lumbar back pain or lower extremity pain: Secondary | ICD-10-CM | POA: Diagnosis not present

## 2024-03-30 LAB — CBC WITH DIFFERENTIAL/PLATELET
Abs Immature Granulocytes: 0.01 10*3/uL (ref 0.00–0.07)
Basophils Absolute: 0 10*3/uL (ref 0.0–0.1)
Basophils Relative: 0 %
Eosinophils Absolute: 0.1 10*3/uL (ref 0.0–0.5)
Eosinophils Relative: 1 %
HCT: 43.5 % (ref 39.0–52.0)
Hemoglobin: 14.7 g/dL (ref 13.0–17.0)
Immature Granulocytes: 0 %
Lymphocytes Relative: 33 %
Lymphs Abs: 1.4 10*3/uL (ref 0.7–4.0)
MCH: 30.9 pg (ref 26.0–34.0)
MCHC: 33.8 g/dL (ref 30.0–36.0)
MCV: 91.4 fL (ref 80.0–100.0)
Monocytes Absolute: 0.4 10*3/uL (ref 0.1–1.0)
Monocytes Relative: 9 %
Neutro Abs: 2.4 10*3/uL (ref 1.7–7.7)
Neutrophils Relative %: 57 %
Platelets: 194 10*3/uL (ref 150–400)
RBC: 4.76 MIL/uL (ref 4.22–5.81)
RDW: 12.6 % (ref 11.5–15.5)
WBC: 4.3 10*3/uL (ref 4.0–10.5)
nRBC: 0 % (ref 0.0–0.2)

## 2024-03-30 LAB — URINALYSIS, ROUTINE W REFLEX MICROSCOPIC
Bacteria, UA: NONE SEEN
Bilirubin Urine: NEGATIVE
Glucose, UA: NEGATIVE mg/dL
Hgb urine dipstick: NEGATIVE
Ketones, ur: NEGATIVE mg/dL
Nitrite: NEGATIVE
Protein, ur: NEGATIVE mg/dL
Specific Gravity, Urine: 1.009 (ref 1.005–1.030)
pH: 6 (ref 5.0–8.0)

## 2024-03-30 LAB — COMPREHENSIVE METABOLIC PANEL WITH GFR
ALT: 27 U/L (ref 0–44)
AST: 27 U/L (ref 15–41)
Albumin: 4.2 g/dL (ref 3.5–5.0)
Alkaline Phosphatase: 71 U/L (ref 38–126)
Anion gap: 11 (ref 5–15)
BUN: 18 mg/dL (ref 8–23)
CO2: 23 mmol/L (ref 22–32)
Calcium: 10.9 mg/dL — ABNORMAL HIGH (ref 8.9–10.3)
Chloride: 104 mmol/L (ref 98–111)
Creatinine, Ser: 0.92 mg/dL (ref 0.61–1.24)
GFR, Estimated: >60 mL/min (ref >60)
Glucose, Bld: 98 mg/dL (ref 70–99)
Potassium: 5.1 mmol/L (ref 3.5–5.1)
Sodium: 139 mmol/L (ref 135–145)
Total Bilirubin: 0.7 mg/dL (ref 0.0–1.2)
Total Protein: 6.7 g/dL (ref 6.5–8.1)

## 2024-03-30 MED ORDER — DEXAMETHASONE SODIUM PHOSPHATE 10 MG/ML IJ SOLN
10.0000 mg | Freq: Once | INTRAMUSCULAR | Status: AC
  Administered 2024-03-30: 10 mg via INTRAVENOUS
  Filled 2024-03-30: qty 1

## 2024-03-30 MED ORDER — PREDNISONE 50 MG PO TABS
50.0000 mg | ORAL_TABLET | Freq: Every day | ORAL | 0 refills | Status: DC
  Filled 2024-03-30: qty 5, 5d supply, fill #0

## 2024-03-30 MED ORDER — OXYCODONE-ACETAMINOPHEN 5-325 MG PO TABS
1.0000 | ORAL_TABLET | Freq: Four times a day (QID) | ORAL | 0 refills | Status: DC | PRN
  Filled 2024-03-30: qty 10, 2d supply, fill #0
  Filled 2024-03-30: qty 5, 2d supply, fill #0

## 2024-03-30 MED ORDER — SODIUM CHLORIDE 0.9 % IV BOLUS: 1000.0000 mL | Freq: Once | INTRAVENOUS | Status: AC

## 2024-03-30 NOTE — ED Provider Notes (Signed)
 [place] EMERGENCY DEPARTMENT AT [facility] Provider Note   CSN: [id number] Arrival date & time: 03/30/24  1324     Patient presents with: Flank Pain   Jeffrey Horn is a 78 y.o. male.   Pt is a 78 yo male with pmhx significant for hld, kidney stones, bph, skin cancer, bladder cancer, gerd, and seasonal allergies.  Pt said he has been having low grade back pain for about 2 weeks.  Today, he had severe pain that caused him to not be able to get up for about 45 minutes.  He had a left over oxycodone  which has helped pain.  Pt said he has family coming to the house this weekend and has been doing a lot of cleaning.  He thinks a kidney stone has come loose.  Pain is worse with movement and with lifting right leg.       Prior to Admission medications   Medication Sig Start Date End Date Taking? Authorizing Provider  oxyCODONE -acetaminophen  (PERCOCET/ROXICET) 5-325 MG tablet Take 1 tablet by mouth every 6 (six) hours as needed for severe pain (pain score 7-10). 03/30/24  Yes Sueellen Emery, MD  predniSONE  (DELTASONE ) 50 MG tablet Take 1 tablet (50 mg total) by mouth daily with breakfast. 03/30/24  Yes Sueellen Emery, MD  lovastatin  (MEVACOR ) 20 MG tablet TAKE 1 TABLET BY MOUTH EVERY DAY IN THE EVENING 03/16/24   Burchette, Marijean Shouts, MD  Multiple Vitamin (MULTIVITAMIN) tablet Take 1 tablet by mouth daily.    [provider]    Allergies: Hydrocodone , Lipitor [atorvastatin], Pravastatin sodium, and Sulfa antibiotics    Review of Systems  Musculoskeletal:  Positive for back pain.  All other systems reviewed and are negative.   Updated Vital Signs BP 118/81   Pulse (!) 55   Temp 97.6 F (36.4 C) (Oral)   Resp 16   SpO2 93%   Physical Exam Vitals and nursing note reviewed.  Constitutional:      Appearance: Normal appearance.  HENT:     Head: Normocephalic and atraumatic.     Right Ear: External ear normal.     Left Ear: External ear normal.     Nose:  Nose normal.     Mouth/Throat:     Mouth: Mucous membranes are moist.     Pharynx: Oropharynx is clear.   Eyes:     Extraocular Movements: Extraocular movements intact.     Conjunctiva/sclera: Conjunctivae normal.     Pupils: Pupils are equal, round, and reactive to light.    Cardiovascular:     Rate and Rhythm: Normal rate and regular rhythm.     Pulses: Normal pulses.     Heart sounds: Normal heart sounds.  Pulmonary:     Effort: Pulmonary effort is normal.     Breath sounds: Normal breath sounds.  Abdominal:     General: Abdomen is flat.     Palpations: Abdomen is soft.   Musculoskeletal:       Arms:     Cervical back: Normal range of motion and neck supple.   Skin:    General: Skin is warm.     Capillary Refill: Capillary refill takes less than 2 seconds.   Neurological:     General: No focal deficit present.     Mental Status: He is alert and oriented to person, place, and time.   Psychiatric:        Mood and Affect: Mood normal.        Behavior: Behavior  normal.     (all labs ordered are listed, but only abnormal results are displayed) Labs Reviewed  URINALYSIS, ROUTINE W REFLEX MICROSCOPIC - Abnormal; Notable for the following components:      Result Value   Leukocytes,Ua TRACE (*)    All other components within normal limits  COMPREHENSIVE METABOLIC PANEL WITH GFR - Abnormal; Notable for the following components:   Calcium 10.9 (*)    All other components within normal limits  CBC WITH DIFFERENTIAL/PLATELET    EKG: None  Radiology: CT L-SPINE NO CHARGE Result Date: 03/30/2024 CLINICAL DATA:  Left flank pain EXAM: CT LUMBAR SPINE WITHOUT CONTRAST TECHNIQUE: Multidetector CT imaging of the lumbar spine was performed without intravenous contrast administration. Multiplanar CT image reconstructions were also generated. RADIATION DOSE REDUCTION: This exam was performed according to the departmental dose-optimization program which includes automated  exposure control, adjustment of the mA and/or kV according to patient size and/or use of iterative reconstruction technique. COMPARISON:  None Available. FINDINGS: Segmentation: 5 lumbar type vertebral bodies. Alignment: Appropriate lumbar lordosis without spondylolisthesis, uncovering of the facet joints, or significant widening of the spinous processes. Vertebrae: Osteopenia. Vertebral body heights are preserved. Interosseous hemangioma at T11. Paraspinal and other soft tissues: No prevertebral edema or soft tissue thickening. No visible canal hematoma. Disc levels: Diffuse facet arthropathy. Multilevel intervertebral disc height loss throughout the lumbar spine, most severe at L2-L3 and L3-L4. At these levels, there are circumferential disc bulges with posterior osteophyte formation causing at least moderate spinal canal narrowing. Mild-to-moderate bilateral neural foraminal stenosis also present at these levels. Circumferential disc bulge also noted at L4-L5 causing mild canal and bilateral neural foraminal stenosis. IMPRESSION: 1. No acute fracture or malalignment of the lumbar spine. 2. Multilevel degenerative changes throughout the lumbar spine, with the most pronounced degenerative disc disease at L2-L3 and L3-L4. Disc bulges at these levels cause at least moderate spinal canal narrowing. Correlation for myelopathic symptoms recommended. Electronically Signed   By: Rance Burrows M.D.   On: 03/30/2024 10:47   CT Renal Stone Study Result Date: 03/30/2024 CLINICAL DATA:  Abdominal/flank pain, stone suspected EXAM: CT ABDOMEN AND PELVIS WITHOUT CONTRAST TECHNIQUE: Multidetector CT imaging of the abdomen and pelvis was performed following the standard protocol without IV contrast. RADIATION DOSE REDUCTION: This exam was performed according to the departmental dose-optimization program which includes automated exposure control, adjustment of the mA and/or kV according to patient size and/or use of iterative  reconstruction technique. COMPARISON:  None Available. FINDINGS: Of note, the lack of intravenous contrast limits evaluation of the solid organ parenchyma and vascularity. Lower chest: No focal airspace consolidation or pleural effusion. Hepatobiliary: 3 cm cyst in the right hepatic lobe.Cholecystectomy. No intrahepatic or extrahepatic biliary ductal dilation. Pancreas: No mass or main ductal dilation. No peripancreatic inflammation or fluid collection. Spleen: Normal size. No mass. Adrenals/Urinary Tract: No adrenal masses. Exophytic right lower pole cyst measuring 8.1 cm. Nonobstructive small bilateral calyceal calculi. No hydronephrosis. The urinary bladder is distended without focal abnormality. Stomach/Bowel: The stomach is decompressed without focal abnormality. No small bowel wall thickening or inflammation. No small bowel obstruction.Normal appendix. Scattered colonic diverticulosis. Vascular/Lymphatic: No aortic aneurysm. Scattered aortoiliac atherosclerosis. No intraabdominal or pelvic lymphadenopathy. Reproductive: No prostatomegaly. No free pelvic fluid. Other: No pneumoperitoneum, ascites, or mesenteric inflammation. Musculoskeletal: No acute fracture or destructive lesion. Multilevel degenerative disc disease of the spine. Osteopenia. IMPRESSION: 1. No acute intra-abdominal or pelvic abnormality. 2. Nonobstructive nephrolithiasis in both kidneys. No hydronephrosis. 3. A few scattered colonic diverticula  are noted. No changes of acute diverticulitis. Aortic Atherosclerosis (ICD10-I70.0). Electronically Signed   By: Rance Burrows M.D.   On: 03/30/2024 10:37     Procedures   Medications Ordered in the ED  dexamethasone  (DECADRON ) injection 10 mg (has no administration in time range)  sodium chloride  0.9 % bolus 1,000 mL (1,000 mLs Intravenous New Bag/Given 03/30/24 1017)                                    Medical Decision Making Amount and/or Complexity of Data Reviewed Labs:  ordered. Radiology: ordered.  Risk Prescription drug management.   This patient presents to the ED for concern of back pain, this involves an extensive number of treatment options, and is a complaint that carries with it a high risk of complications and morbidity.  The differential diagnosis includes msk, kidney stone, pyelo   Co morbidities that complicate the patient evaluation   hld, kidney stones, bph, skin cancer, bladder cancer, gerd, and seasonal allergies   Additional history obtained:  Additional history obtained from epic chart review External records from outside source obtained and reviewed including wife   Lab Tests:  I Ordered, and personally interpreted labs.  The pertinent results include:  cbc nl, ua nl   Imaging Studies ordered:  I ordered imaging studies including ct renal, ct lumbar  I independently visualized and interpreted imaging which showed  CT renal: No acute intra-abdominal or pelvic abnormality.  2. Nonobstructive nephrolithiasis in both kidneys. No  hydronephrosis.  3. A few scattered colonic diverticula are noted. No changes of  acute diverticulitis.    Aortic Atherosclerosis (ICD10-I70.0).  CT lumbar:  No acute fracture or malalignment of the lumbar spine.  2. Multilevel degenerative changes throughout the lumbar spine, with  the most pronounced degenerative disc disease at L2-L3 and L3-L4.  Disc bulges at these levels cause at least moderate spinal canal  narrowing. Correlation for myelopathic symptoms recommended.   I agree with the radiologist interpretation   Cardiac Monitoring:  The patient was maintained on a cardiac monitor.  I personally viewed and interpreted the cardiac monitored which showed an underlying rhythm of: nsr   Medicines ordered and prescription drug management:  I ordered medication including ivfs  for sx  Reevaluation of the patient after these medicines showed that the patient improved I have reviewed  the patients home medicines and have made adjustments as needed   Test Considered:  ct   Critical Interventions:  Pain control  Problem List / ED Course:  Lumbar radicular pain:  pt d/c with oxy and prednisone .  He is instructed to return if worse.  F/u with pcp.   Reevaluation:  After the interventions noted above, I reevaluated the patient and found that they have :improved   Social Determinants of Health:  Lives at home   Dispostion:  After consideration of the diagnostic results and the patients response to treatment, I feel that the patent would benefit from discharge with outpatient f/u.       Final diagnoses:  Lumbar radiculopathy    ED Discharge Orders          Ordered    oxyCODONE -acetaminophen  (PERCOCET/ROXICET) 5-325 MG tablet  Every 6 hours PRN        03/30/24 1108    predniSONE  (DELTASONE ) 50 MG tablet  Daily with breakfast        03/30/24 1108  Sueellen Emery, MD 03/30/24 1124

## 2024-03-30 NOTE — ED Triage Notes (Signed)
 C/o left side flank pain. Hx of kidney stones and bladder cancer. No longer getting treatment.

## 2024-04-03 ENCOUNTER — Ambulatory Visit (INDEPENDENT_AMBULATORY_CARE_PROVIDER_SITE_OTHER): Admitting: Family Medicine

## 2024-04-03 ENCOUNTER — Encounter: Payer: Self-pay | Admitting: Family Medicine

## 2024-04-03 VITALS — BP 148/72 | HR 74 | Wt 175.9 lb

## 2024-04-03 DIAGNOSIS — R030 Elevated blood-pressure reading, without diagnosis of hypertension: Secondary | ICD-10-CM

## 2024-04-03 DIAGNOSIS — M545 Low back pain, unspecified: Secondary | ICD-10-CM | POA: Diagnosis not present

## 2024-04-03 NOTE — Progress Notes (Signed)
 Established Patient Office Visit  Subjective   Patient ID: Jeffrey Horn, male    DOB: 1945-11-09  Age: 78 y.o. MRN: [id number]  Chief Complaint  Patient presents with   Hospitalization Follow-up    HPI   Jeffrey Horn is seen for ER follow-up for recent low back pain.  He does have history of bladder cancer in the past as well as history of kidney stones and hyperlipidemia.  He states that he had been doing some increased cleaning around the home and this was about 2 weeks prior to ER visit.  That pain had been fairly low-grade.  On the day of ER visit which was the 13th he had's fairly severe pain that made it difficult for him to get up from seated position.  He had right sided low back pain with hip flexion.  He initially thought this may be related to kidney stone.  CT renal stone study revealed nonobstructive bilateral calculi.  He had CT spine which showed multilevel degenerative changes throughout lumbar spine especially at L2-3 and L3-4.  Disc bulges at these levels with at least moderate spinal canal narrowing.  He was placed on prednisone  50 mg daily and pain has essentially ceased with the prednisone .  No radiculitis symptoms.  No urine or stool incontinence.  He did have some lab work which was reviewed.  Calcium came back slightly high and corrected this was 10.7.  He had urinalysis with small leukocytes.  No burning with urination.  Blood pressure is up slightly today which is atypical for him.  He thinks this may be related to the prednisone .  Past Medical History:  Diagnosis Date   Allergy    BPH (benign prostatic hypertrophy)    Chicken pox    GERD (gastroesophageal reflux disease)    History of basal cell carcinoma excision    elbow   History of kidney stones    History of skin cancer    EXCISION MALIGNANT NEOPLASM   Hyperlipidemia    Hypogonadism male    Nocturia    Prostate nodule    Recurrent bladder papillary carcinoma (HCC) MONTIORED BY DR Isla Mari and  oncologist-  dr Dirk Fredericks   First dx Dec 2014--  S/P  TURBT's  and BCG and Mitomyocin C tx's   Renal calculus, bilateral    Renal cyst, acquired    Past Surgical History:  Procedure Laterality Date   CYSTOSCOPY W/ RETROGRADES Bilateral 10/30/2015   Procedure: CYSTOSCOPY WITH RETROGRADE PYELOGRAM;  Surgeon: Annamarie Kid, MD;  Location: [facility];  Service: Urology;  Laterality: Bilateral;   HEMORRHOID BANDING     MD OFFICE   LAPAROSCOPIC CHOLECYSTECTOMY  02/24/2009   LASIK Bilateral 06/18/1989   TONSILLECTOMY  as child   TONSILLECTOMY  2017   TRANSURETHRAL RESECTION OF BLADDER TUMOR N/A 12/03/2013   Procedure: TRANSURETHRAL RESECTION OF BLADDER TUMOR (TURBT)AT BLADDER NECK 8CM, TRANSURETHRAL RESECTION OF PROSTATE;  Surgeon: Edmund Gouge, MD;  Location: [facility];  Service: Urology;  Laterality: N/A;   TRANSURETHRAL RESECTION OF BLADDER TUMOR N/A 11/01/2014   Procedure: TRANSURETHRAL RESECTION OF BLADDER TUMOR (TURBT);  Surgeon: Edmund Gouge, MD;  Location: [facility];  Service: Urology;  Laterality: N/A;   TRANSURETHRAL RESECTION OF BLADDER TUMOR N/A 10/30/2015   Procedure: TRANSURETHRAL RESECTION OF BLADDER TUMOR X2 (TURBT) WITH TAUBER FORCEPS;  Surgeon: Annamarie Kid, MD;  Location: [facility];  Service: Urology;  Laterality: N/A;   TRANSURETHRAL RESECTION OF BLADDER TUMOR WITH GYRUS (  TURBT-GYRUS) N/A 07/12/2014   Procedure: TRANSURETHRAL RESECTION OF BLADDER TUMOR WITH GYRUS (TURBT-GYRUS);  Surgeon: Edmund Gouge, MD;  Location: [facility];  Service: Urology;  Laterality: N/A;   WISDOM TEETH EXTRACTED  10/19/2003   oral surgeon office- bp dropped- but recovered    reports that he has never smoked. He has never used smokeless tobacco. He reports current alcohol use of about 14.0 standard drinks of alcohol per week. He reports that he does not use drugs. family history includes Alcohol abuse in his  father and mother; Cancer (age of onset: 47) in his father; Hyperlipidemia in his daughter. Allergies  Allergen Reactions   Hydrocodone  Other (See Comments)    HALLUCINATIONS   Lipitor [Atorvastatin] Other (See Comments)    muscle soreness   Pravastatin Sodium Other (See Comments)    INCREASED CK   Sulfa Antibiotics Rash    Review of Systems  Constitutional:  Negative for chills and fever.  Cardiovascular:  Negative for chest pain.  Gastrointestinal:  Negative for abdominal pain.  Genitourinary:  Negative for dysuria.  Musculoskeletal:        See HPI   Psychiatric/Behavioral:  Negative for memory loss.       Objective:     BP (!) 148/72 (BP Location: Left Arm, Patient Position: Sitting, Cuff Size: Normal)   Pulse 74   Wt 175 lb 14.4 oz (79.8 kg)   SpO2 97%   BMI 24.76 kg/m  BP Readings from Last 3 Encounters:  04/03/24 (!) 148/72  03/30/24 118/81  05/09/23 110/70   Wt Readings from Last 3 Encounters:  04/03/24 175 lb 14.4 oz (79.8 kg)  05/09/23 174 lb 14.4 oz (79.3 kg)  05/05/22 176 lb 3.2 oz (79.9 kg)      Physical Exam Constitutional:      Appearance: He is well-developed.  HENT:     Right Ear: External ear normal.     Left Ear: External ear normal.   Eyes:     Pupils: Pupils are equal, round, and reactive to light.   Neck:     Thyroid : No thyromegaly.   Cardiovascular:     Rate and Rhythm: Normal rate and regular rhythm.  Pulmonary:     Effort: Pulmonary effort is normal. No respiratory distress.     Breath sounds: Normal breath sounds. No wheezing or rales.   Musculoskeletal:     Cervical back: Neck supple.     Comments: Straight leg raises are negative bilaterally.  No lower extremity edema.   Neurological:     Mental Status: He is alert and oriented to person, place, and time.     Comments: Full strength lower extremities.  2+ reflexes knee and ankle bilaterally.     No results found for any visits on 04/03/24.  Last CBC Lab Results   Component Value Date   WBC 4.3 03/30/2024   HGB 14.7 03/30/2024   HCT 43.5 03/30/2024   MCV 91.4 03/30/2024   MCH 30.9 03/30/2024   RDW 12.6 03/30/2024   PLT 194 03/30/2024   Last metabolic panel Lab Results  Component Value Date   GLUCOSE 98 03/30/2024   NA 139 03/30/2024   K 5.1 03/30/2024   CL 104 03/30/2024   CO2 23 03/30/2024   BUN 18 03/30/2024   CREATININE 0.92 03/30/2024   GFRNONAA >60 03/30/2024   CALCIUM 10.9 (H) 03/30/2024   PROT 6.7 03/30/2024   ALBUMIN 4.2 03/30/2024   BILITOT 0.7 03/30/2024   ALKPHOS 71 03/30/2024  AST 27 03/30/2024   ALT 27 03/30/2024   ANIONGAP 11 03/30/2024   Last lipids Lab Results  Component Value Date   CHOL 173 05/09/2023   HDL 56.70 05/09/2023   LDLCALC 99 05/09/2023   TRIG 86.0 05/09/2023   CHOLHDL 3 05/09/2023   Last hemoglobin A1c No results found for: HGBA1C    The 10-year ASCVD risk score (Arnett DK, et al., 2019) is: 36.3%    Assessment & Plan:   #1 recent right lumbar back pain.  CT scan showed degenerative changes specially L2-3 and L3-4 with moderate spinal canal narrowing.  Patient improved with prednisone  50 mg daily and has 1 more day.  Finish out prednisone  and be in touch if he has recurrent pain.  He is aware of appropriate lifting technique.  Avoiding heavy lifting.  Continue walking and other exercise as tolerated  #2 elevated blood pressure.  No history of hypertension.  Observe after coming off prednisone  to see if this remains up.  He is scheduled physical in July  #3 mild hypercalcemia.  Corrected calcium 10.7.  Repeat a physical.  If still up at that point consider parathyroid hormone and other evaluation.  He has had some intermittent numbness in the feet and consider myeloma panel.  Glean Lamy, MD

## 2024-04-12 DIAGNOSIS — N138 Other obstructive and reflux uropathy: Secondary | ICD-10-CM | POA: Diagnosis not present

## 2024-04-12 DIAGNOSIS — N401 Enlarged prostate with lower urinary tract symptoms: Secondary | ICD-10-CM | POA: Diagnosis not present

## 2024-04-12 DIAGNOSIS — C672 Malignant neoplasm of lateral wall of bladder: Secondary | ICD-10-CM | POA: Diagnosis not present

## 2024-05-11 ENCOUNTER — Ambulatory Visit: Payer: Medicare Other | Admitting: Family Medicine

## 2024-05-11 ENCOUNTER — Ambulatory Visit: Payer: Self-pay | Admitting: Family Medicine

## 2024-05-11 VITALS — BP 110/74 | HR 68 | Temp 97.7°F | Ht 69.2 in | Wt 176.3 lb

## 2024-05-11 DIAGNOSIS — E785 Hyperlipidemia, unspecified: Secondary | ICD-10-CM | POA: Diagnosis not present

## 2024-05-11 LAB — COMPREHENSIVE METABOLIC PANEL WITH GFR
ALT: 22 U/L (ref 0–53)
AST: 22 U/L (ref 0–37)
Albumin: 4.2 g/dL (ref 3.5–5.2)
Alkaline Phosphatase: 66 U/L (ref 39–117)
BUN: 15 mg/dL (ref 6–23)
CO2: 27 meq/L (ref 19–32)
Calcium: 9.9 mg/dL (ref 8.4–10.5)
Chloride: 105 meq/L (ref 96–112)
Creatinine, Ser: 0.9 mg/dL (ref 0.40–1.50)
GFR: 81.29 mL/min
Glucose, Bld: 99 mg/dL (ref 70–99)
Potassium: 4.7 meq/L (ref 3.5–5.1)
Sodium: 139 meq/L (ref 135–145)
Total Bilirubin: 0.8 mg/dL (ref 0.2–1.2)
Total Protein: 6.7 g/dL (ref 6.0–8.3)

## 2024-05-11 LAB — LIPID PANEL
Cholesterol: 195 mg/dL (ref 0–200)
HDL: 61.5 mg/dL (ref >39.00)
LDL Cholesterol: 112 mg/dL — ABNORMAL HIGH (ref 0–99)
NonHDL: 133
Total CHOL/HDL Ratio: 3
Triglycerides: 106 mg/dL (ref 0.0–149.0)
VLDL: 21.2 mg/dL (ref 0.0–40.0)

## 2024-05-11 MED ORDER — LOVASTATIN 20 MG PO TABS: 20.0000 mg | ORAL_TABLET | Freq: Every day | ORAL | 3 refills | Status: AC

## 2024-05-11 NOTE — Progress Notes (Signed)
 Established Patient Office Visit  Subjective   Patient ID: Jeffrey Horn, male    DOB: 03-30-1946  Age: 78 y.o. MRN: [id number]  Chief Complaint  Patient presents with   Annual Exam    Pt is fasting.    HPI   Jeffrey Horn is seen for annual follow-up.  Very health-conscious.  He is very active and exercises regularly.  Stays active with house maintenance in addition to regular structured exercise.  He takes lovastatin  for hyperlipidemia but no other regular medications.  Went to the ER in June with some back pain.  Back pain somewhat improved.  Did have corrected calcium at that time slightly elevated around 10.7.  Past history of kidney stones.  Takes no medications other than lovastatin .  No recent falls.  No recent chest pains.  Appetite and weight stable.  Past Medical History:  Diagnosis Date   Allergy    BPH (benign prostatic hypertrophy)    Chicken pox    GERD (gastroesophageal reflux disease)    History of basal cell carcinoma excision    elbow   History of kidney stones    History of skin cancer    EXCISION MALIGNANT NEOPLASM   Hyperlipidemia    Hypogonadism male    Nocturia    Prostate nodule    Recurrent bladder papillary carcinoma (HCC) MONTIORED BY DR CHALES and oncologist-  dr amadeo   First dx Dec 2014--  S/P  TURBT's  and BCG and Mitomyocin C tx's   Renal calculus, bilateral    Renal cyst, acquired    Past Surgical History:  Procedure Laterality Date   CYSTOSCOPY W/ RETROGRADES Bilateral 10/30/2015   Procedure: CYSTOSCOPY WITH RETROGRADE PYELOGRAM;  Surgeon: Arlena CHALES, MD;  Location: [facility];  Service: Urology;  Laterality: Bilateral;   HEMORRHOID BANDING     MD OFFICE   LAPAROSCOPIC CHOLECYSTECTOMY  02/24/2009   LASIK Bilateral 06/18/1989   TONSILLECTOMY  as child   TONSILLECTOMY  2017   TRANSURETHRAL RESECTION OF BLADDER TUMOR N/A 12/03/2013   Procedure: TRANSURETHRAL RESECTION OF BLADDER TUMOR (TURBT)AT BLADDER  NECK 8CM, TRANSURETHRAL RESECTION OF PROSTATE;  Surgeon: Arlena LILLETTE CHALES, MD;  Location: [facility];  Service: Urology;  Laterality: N/A;   TRANSURETHRAL RESECTION OF BLADDER TUMOR N/A 11/01/2014   Procedure: TRANSURETHRAL RESECTION OF BLADDER TUMOR (TURBT);  Surgeon: Arlena LILLETTE CHALES, MD;  Location: [facility];  Service: Urology;  Laterality: N/A;   TRANSURETHRAL RESECTION OF BLADDER TUMOR N/A 10/30/2015   Procedure: TRANSURETHRAL RESECTION OF BLADDER TUMOR X2 (TURBT) WITH TAUBER FORCEPS;  Surgeon: Arlena CHALES, MD;  Location: [facility];  Service: Urology;  Laterality: N/A;   TRANSURETHRAL RESECTION OF BLADDER TUMOR WITH GYRUS (TURBT-GYRUS) N/A 07/12/2014   Procedure: TRANSURETHRAL RESECTION OF BLADDER TUMOR WITH GYRUS (TURBT-GYRUS);  Surgeon: Arlena LILLETTE CHALES, MD;  Location: [facility];  Service: Urology;  Laterality: N/A;   WISDOM TEETH EXTRACTED  10/19/2003   oral surgeon office- bp dropped- but recovered    reports that he has never smoked. He has never used smokeless tobacco. He reports current alcohol use of about 14.0 standard drinks of alcohol per week. He reports that he does not use drugs. family history includes Alcohol abuse in his father and mother; Cancer (age of onset: 4) in his father; Hyperlipidemia in his daughter. Allergies  Allergen Reactions   Hydrocodone  Other (See Comments)    HALLUCINATIONS   Lipitor [Atorvastatin] Other (See Comments)    muscle soreness  Pravastatin Sodium Other (See Comments)    INCREASED CK   Sulfa Antibiotics Rash    Review of Systems  Constitutional:  Negative for chills, fever and malaise/fatigue.  Eyes:  Negative for blurred vision.  Respiratory:  Negative for shortness of breath.   Cardiovascular:  Negative for chest pain.  Neurological:  Negative for dizziness, weakness and headaches.      Objective:     BP 110/74 (BP Location: Right Arm, Patient Position: Sitting,  Cuff Size: Normal)   Pulse 68   Temp 97.7 F (36.5 C) (Oral)   Ht 5' 9.2 (1.758 m)   Wt 176 lb 4.8 oz (80 kg)   SpO2 94%   BMI 25.88 kg/m  BP Readings from Last 3 Encounters:  05/11/24 110/74  04/03/24 (!) 148/72  03/30/24 118/81   Wt Readings from Last 3 Encounters:  05/11/24 176 lb 4.8 oz (80 kg)  04/03/24 175 lb 14.4 oz (79.8 kg)  05/09/23 174 lb 14.4 oz (79.3 kg)      Physical Exam Vitals reviewed.  Constitutional:      General: He is not in acute distress.    Appearance: He is well-developed. He is not ill-appearing.  HENT:     Right Ear: External ear normal.     Left Ear: External ear normal.  Eyes:     Pupils: Pupils are equal, round, and reactive to light.  Neck:     Thyroid : No thyromegaly.  Cardiovascular:     Rate and Rhythm: Normal rate and regular rhythm.  Pulmonary:     Effort: Pulmonary effort is normal. No respiratory distress.     Breath sounds: Normal breath sounds. No wheezing or rales.  Musculoskeletal:     Cervical back: Neck supple.  Neurological:     Mental Status: He is alert and oriented to person, place, and time.      Results for orders placed or performed in visit on 05/11/24  CMP  Result Value Ref Range   Sodium 139 135 - 145 mEq/L   Potassium 4.7 3.5 - 5.1 mEq/L   Chloride 105 96 - 112 mEq/L   CO2 27 19 - 32 mEq/L   Glucose, Bld 99 70 - 99 mg/dL   BUN 15 6 - 23 mg/dL   Creatinine, Ser 9.09 0.40 - 1.50 mg/dL   Total Bilirubin 0.8 0.2 - 1.2 mg/dL   Alkaline Phosphatase 66 39 - 117 U/L   AST 22 0 - 37 U/L   ALT 22 0 - 53 U/L   Total Protein 6.7 6.0 - 8.3 g/dL   Albumin 4.2 3.5 - 5.2 g/dL   GFR 18.70 >39.99 mL/min   Calcium 9.9 8.4 - 10.5 mg/dL  Lipid panel  Result Value Ref Range   Cholesterol 195 0 - 200 mg/dL   Triglycerides 893.9 0.0 - 149.0 mg/dL   HDL 38.49 >60.99 mg/dL   VLDL 78.7 0.0 - 59.9 mg/dL   LDL Cholesterol 887 (H) 0 - 99 mg/dL   Total CHOL/HDL Ratio 3    NonHDL 133.00     Last CBC Lab Results   Component Value Date   WBC 4.3 03/30/2024   HGB 14.7 03/30/2024   HCT 43.5 03/30/2024   MCV 91.4 03/30/2024   MCH 30.9 03/30/2024   RDW 12.6 03/30/2024   PLT 194 03/30/2024   Last metabolic panel Lab Results  Component Value Date   GLUCOSE 99 05/11/2024   NA 139 05/11/2024   K 4.7 05/11/2024   CL 105  05/11/2024   CO2 27 05/11/2024   BUN 15 05/11/2024   CREATININE 0.90 05/11/2024   GFR 81.29 05/11/2024   CALCIUM 9.9 05/11/2024   PROT 6.7 05/11/2024   ALBUMIN 4.2 05/11/2024   BILITOT 0.8 05/11/2024   ALKPHOS 66 05/11/2024   AST 22 05/11/2024   ALT 22 05/11/2024   ANIONGAP 11 03/30/2024   Last lipids Lab Results  Component Value Date   CHOL 195 05/11/2024   HDL 61.50 05/11/2024   LDLCALC 112 (H) 05/11/2024   TRIG 106.0 05/11/2024   CHOLHDL 3 05/11/2024      The 89-bzjm ASCVD risk score (Arnett DK, et al., 2019) is: 23.5%    Assessment & Plan:   #1 hyperlipidemia.  Refill lovastatin  for 1 year.  Check lipid and CMP.  Continue low saturated fat diet  #2 mild hypercalcemia by recent labs through the ER.  Recheck comprehensive metabolic panel.  If calcium is increasing consider further evaluation including PTH level   No follow-ups on file.    Wolm Scarlet, MD

## 2024-06-15 ENCOUNTER — Ambulatory Visit: Payer: Self-pay

## 2024-06-15 DIAGNOSIS — H9202 Otalgia, left ear: Secondary | ICD-10-CM | POA: Diagnosis not present

## 2024-06-15 DIAGNOSIS — T162XXA Foreign body in left ear, initial encounter: Secondary | ICD-10-CM | POA: Diagnosis not present

## 2024-06-15 NOTE — Telephone Encounter (Signed)
 Noted.  Jeffrey Covey MD [facility] at [facility]

## 2024-06-15 NOTE — Telephone Encounter (Signed)
 Copied from CRM #[id number]. Topic: General - Other >> Jun 15, 2024  1:32 PM Thersia C wrote: Reason for CRM: Patient called in regarding a missed call, patient stated he went to the UC and got it taken care of and got it out. But if needs to be contacted can be called back

## 2024-06-15 NOTE — Telephone Encounter (Signed)
 Message from Lauren C sent at 06/15/2024  9:47 AM EDT  Pt has hearing aid stuck in left ear. No appts avail until Tuesday. Per Candice w/ [facility], pt should be triaged. I scheduled him for 7:30AM Tues. He said he will call back and cancel if he finds something sooner.

## 2024-06-15 NOTE — Telephone Encounter (Signed)
Unable to reach pt x3 attempts 

## 2024-06-19 ENCOUNTER — Ambulatory Visit: Admitting: Internal Medicine

## 2024-07-02 DIAGNOSIS — Z23 Encounter for immunization: Secondary | ICD-10-CM | POA: Diagnosis not present

## 2024-07-10 ENCOUNTER — Ambulatory Visit (INDEPENDENT_AMBULATORY_CARE_PROVIDER_SITE_OTHER)

## 2024-07-10 VITALS — Ht 69.2 in | Wt 176.0 lb

## 2024-07-10 DIAGNOSIS — Z0000 Encounter for general adult medical examination without abnormal findings: Secondary | ICD-10-CM

## 2024-07-10 NOTE — Progress Notes (Signed)
 Subjective:   Jeffrey Horn is a 78 y.o. who presents for a Medicare Wellness preventive visit.  As a reminder, Annual Wellness Visits don't include a physical exam, and some assessments may be limited, especially if this visit is performed virtually. We may recommend an in-person follow-up visit with your provider if needed.  Visit Complete: Virtual I connected with  Jeffrey Horn on 07/10/24 by a audio enabled telemedicine application and verified that I am speaking with the correct person using two identifiers.  Patient Location: Home  Provider Location: Home Office  I discussed the limitations of evaluation and management by telemedicine. The patient expressed understanding and agreed to proceed.  Vital Signs: Because this visit was a virtual/telehealth visit, some criteria may be missing or patient reported. Any vitals not documented were not able to be obtained and vitals that have been documented are patient reported.  VideoDeclined- This patient declined Librarian, academic. Therefore the visit was completed with audio only.  Persons Participating in Visit: Patient.  AWV Questionnaire: Yes: Patient Medicare AWV questionnaire was completed by the patient on 07/06/2024; I have confirmed that all information answered by patient is correct and no changes since this date.  Cardiac Risk Factors include: advanced age (>90men, >48 women);male gender;Other (see comment);dyslipidemia, Risk factor comments: baldder cancer     Objective:    Today's Vitals   07/10/24 0830  Weight: 176 lb (79.8 kg)  Height: 5' 9.2 (1.758 m)   Body mass index is 25.84 kg/m.     07/10/2024    8:50 AM 03/30/2024    9:37 AM 10/12/2021   10:49 AM 05/14/2021   10:41 AM 06/10/2016    9:40 AM 10/30/2015   10:40 AM 12/03/2014    1:29 PM  Advanced Directives  Does Patient Have a Medical Advance Directive? Yes No Yes Yes Yes  Yes  No   Type of Engineer, mining of [place];Living will  Healthcare Power of [place];Living will Healthcare Power of [place];Living will Healthcare Power of [place];Living will  Healthcare Power of [place];Living will    Does patient want to make changes to medical advance directive?      No - Patient declined    Copy of Healthcare Power of Attorney in Chart? No - copy requested   No - copy requested No - copy requested  No - copy requested    Would patient like information on creating a medical advance directive?  No - Patient declined     No - patient declined information      Data saved with a previous flowsheet row definition    Current Medications (verified) Outpatient Encounter Medications as of 07/10/2024  Medication Sig   Levocetirizine Dihydrochloride (XYZAL PO) Take by mouth at bedtime.   lovastatin  (MEVACOR ) 20 MG tablet Take 1 tablet (20 mg total) by mouth at bedtime.   No facility-administered encounter medications on file as of 07/10/2024.    Allergies (verified) Hydrocodone , Lipitor [atorvastatin], Pravastatin sodium, and Sulfa antibiotics   History: Past Medical History:  Diagnosis Date   Allergy    BPH (benign prostatic hypertrophy)    Chicken pox    GERD (gastroesophageal reflux disease)    History of basal cell carcinoma excision    elbow   History of kidney stones    History of skin cancer    EXCISION MALIGNANT NEOPLASM   Hyperlipidemia    Hypogonadism male    Nocturia    Prostate nodule  Recurrent bladder papillary carcinoma (HCC) MONTIORED BY DR CHALES and oncologist-  dr amadeo   First dx Dec 2014--  S/P  TURBT's  and BCG and Mitomyocin C tx's   Renal calculus, bilateral    Renal cyst, acquired    Past Surgical History:  Procedure Laterality Date   CYSTOSCOPY W/ RETROGRADES Bilateral 10/30/2015   Procedure: CYSTOSCOPY WITH RETROGRADE PYELOGRAM;  Surgeon: Arlena CHALES, MD;  Location: [facility];  Service: Urology;  Laterality: Bilateral;    HEMORRHOID BANDING     MD OFFICE   LAPAROSCOPIC CHOLECYSTECTOMY  02/24/2009   LASIK Bilateral 06/18/1989   TONSILLECTOMY  as child   TONSILLECTOMY  2017   TRANSURETHRAL RESECTION OF BLADDER TUMOR N/A 12/03/2013   Procedure: TRANSURETHRAL RESECTION OF BLADDER TUMOR (TURBT)AT BLADDER NECK 8CM, TRANSURETHRAL RESECTION OF PROSTATE;  Surgeon: Arlena LILLETTE CHALES, MD;  Location: [facility];  Service: Urology;  Laterality: N/A;   TRANSURETHRAL RESECTION OF BLADDER TUMOR N/A 11/01/2014   Procedure: TRANSURETHRAL RESECTION OF BLADDER TUMOR (TURBT);  Surgeon: Arlena LILLETTE CHALES, MD;  Location: [facility];  Service: Urology;  Laterality: N/A;   TRANSURETHRAL RESECTION OF BLADDER TUMOR N/A 10/30/2015   Procedure: TRANSURETHRAL RESECTION OF BLADDER TUMOR X2 (TURBT) WITH TAUBER FORCEPS;  Surgeon: Arlena CHALES, MD;  Location: [facility];  Service: Urology;  Laterality: N/A;   TRANSURETHRAL RESECTION OF BLADDER TUMOR WITH GYRUS (TURBT-GYRUS) N/A 07/12/2014   Procedure: TRANSURETHRAL RESECTION OF BLADDER TUMOR WITH GYRUS (TURBT-GYRUS);  Surgeon: Arlena LILLETTE CHALES, MD;  [facility];  Service: Urology;  Laterality: N/A;   WISDOM TEETH EXTRACTED  10/19/2003   oral surgeon office- bp dropped- but recovered   Family History  Problem Relation Age of Onset   Alcohol abuse Mother    Cancer Father 66       lung cancer   Alcohol abuse Father    Hyperlipidemia Daughter    Social History   Socioeconomic History   Marital status: Married    Spouse name: Barnie   Number of children: 3   Years of education: Not on file   Highest education level: Bachelor's degree (e.g., BA, AB, BS)  Occupational History   Occupation: RETIRED  Tobacco Use   Smoking status: Never   Smokeless tobacco: Never  Vaping Use   Vaping status: Never Used  Substance and Sexual Activity   Alcohol use: Yes    Alcohol/week: 14.0 standard drinks of alcohol    Types: 14  Cans of beer per week    Comment: COUPLE  BEERS A DAY   Drug use: No   Sexual activity: Not on file  Other Topics Concern   Not on file  Social History Narrative   Lives with wife/2025   Social Drivers of Health   Financial Resource Strain: Low Risk  (07/06/2024)   Overall Financial Resource Strain (CARDIA)    Difficulty of Paying Living Expenses: Not hard at all  Food Insecurity: No Food Insecurity (07/06/2024)   Hunger Vital Sign    Worried About Running Out of Food in the Last Year: Never true    Ran Out of Food in the Last Year: Never true  Transportation Needs: No Transportation Needs (07/06/2024)   PRAPARE - Administrator, Civil Service (Medical): No    Lack of Transportation (Non-Medical): No  Physical Activity: Sufficiently Active (07/06/2024)   Exercise Vital Sign    Days of Exercise per Week: 3 days    Minutes of Exercise  per Session: 120 min  Stress: No Stress Concern Present (07/06/2024)   [employer] of Occupational Health - Occupational Stress Questionnaire    Feeling of Stress: Not at all  Social Connections: Moderately Integrated (07/06/2024)   Social Connection and Isolation Panel    Frequency of Communication with Friends and Family: More than three times a week    Frequency of Social Gatherings with Friends and Family: More than three times a week    Attends Religious Services: 1 to 4 times per year    Active Member of [employer] or Organizations: No    Attends Engineer, structural: Not on file    Marital Status: Married    Tobacco Counseling Counseling given: Not Answered    Clinical Intake:  Pre-visit preparation completed: Yes  Pain : No/denies pain     BMI - recorded: 25.84 Nutritional Status: BMI 25 -29 Overweight Nutritional Risks: None Diabetes: No  No results found for: HGBA1C   How often do you need to have someone help you when you read instructions, pamphlets, or other written materials from your doctor or  pharmacy?: 1 - Never  Interpreter Needed?: No  Information entered by :: Laneah Luft, RMA   Activities of Daily Living     07/06/2024   12:25 PM  In your present state of health, do you have any difficulty performing the following activities:  Hearing? 1  Comment Wears hearing aides  Vision? 0  Difficulty concentrating or making decisions? 0  Walking or climbing stairs? 0  Dressing or bathing? 0  Doing errands, shopping? 0  Preparing Food and eating ? N  Using the Toilet? N  In the past six months, have you accidently leaked urine? N  Do you have problems with loss of bowel control? N  Managing your Medications? N  Managing your Finances? N  Housekeeping or managing your Housekeeping? N    Patient Care Team: Micheal Wolm ORN, MD as PCP - General (Family Medicine) Chales Idol, MD as Consulting Physician (Urology)  I have updated your Care Teams any recent Medical Services you may have received from other providers in the past year.     Assessment:   This is a routine wellness examination for [place].  Hearing/Vision screen Hearing Screening - Comments:: Wears hearing aides Vision Screening - Comments:: Laser surgery/Denies vision issues./[facility]    Goals Addressed               This Visit's Progress     Patient Stated (pt-stated)        Maintain health and strength/2025       Depression Screen     07/10/2024    8:51 AM 05/11/2024    8:05 AM 05/09/2023    8:17 AM 11/23/2021    3:39 PM 10/21/2021    7:45 AM 05/14/2021   10:42 AM 05/14/2021   10:37 AM  PHQ 2/9 Scores  PHQ - 2 Score 0 0 0 1 0 0 0  PHQ- 9 Score 0   10       Fall Risk     07/06/2024   12:25 PM 05/09/2023    8:17 AM 05/05/2022    8:49 AM 11/23/2021    3:40 PM 11/23/2021    9:14 AM  Fall Risk   Falls in the past year? 1 0 1 1 1   Number falls in past yr: 0 0 0 0 0  Injury with Fall? 0 0 1 1 1   Risk for fall  due to : Impaired balance/gait No Fall Risks No Fall Risks    Follow  up Falls evaluation completed;Falls prevention discussed Falls evaluation completed Falls evaluation completed        Data saved with a previous flowsheet row definition    MEDICARE RISK AT HOME:  Medicare Risk at Home Any stairs in or around the home?: (Patient-Rptd) Yes If so, are there any without handrails?: (Patient-Rptd) No Home free of loose throw rugs in walkways, pet beds, electrical cords, etc?: (Patient-Rptd) Yes Adequate lighting in your home to reduce risk of falls?: (Patient-Rptd) Yes Life alert?: (Patient-Rptd) No Use of a cane, walker or w/c?: (Patient-Rptd) No Grab bars in the bathroom?: (Patient-Rptd) No Shower chair or bench in shower?: (Patient-Rptd) No Elevated toilet seat or a handicapped toilet?: (Patient-Rptd) No  TIMED UP AND GO:  Was the test performed?  No  Cognitive Function: Declined/Normal: No cognitive concerns noted by patient or family. Patient alert, oriented, able to answer questions appropriately and recall recent events. No signs of memory loss or confusion.        Immunizations Immunization History  Administered Date(s) Administered   Fluad Quad(high Dose 65+) 07/09/2022   Fluad Trivalent(High Dose 65+) 07/01/2023   Fluzone  Influenza virus vaccine,trivalent (IIV3), split virus 07/03/2014, 07/02/2015, 07/06/2016, 06/06/2018, 06/26/2020   INFLUENZA, HIGH DOSE SEASONAL PF 06/29/2017, 07/02/2024   Influenza Split 07/29/2009   Influenza-Unspecified 07/15/2021   Moderna Covid-19 Fall Seasonal Vaccine 98yrs & older 09/02/2023   Moderna Sars-Covid-2 Vaccination 11/16/2019, 12/12/2019   PFIZER Comirnaty(Gray Top)Covid-19 Tri-Sucrose Vaccine 08/26/2020, 05/07/2021   PNEUMOCOCCAL CONJUGATE-20 08/25/2023   Pneumococcal Conjugate-13 11/15/2013   Pneumococcal Polysaccharide-23 10/20/2002, 10/17/2008   Respiratory Syncytial Virus Vaccine,Recomb Aduvanted(Arexvy) 07/13/2022   Td 05/20/2016   Td (Adult) 05/20/2016   Tdap 10/20/2009   Unspecified  SARS-COV-2 Vaccination 07/20/2022   Zoster Recombinant(Shingrix) 12/17/2016, 03/05/2017, 03/05/2017   Zoster, Live 08/29/2006, 12/17/2016, 03/05/2017    Screening Tests Health Maintenance  Topic Date Due   COVID-19 Vaccine (7 - Mixed Product risk 2024-25 season) 06/18/2024   Medicare Annual Wellness (AWV)  07/10/2025   DTaP/Tdap/Td (4 - Td or Tdap) 05/20/2026   Influenza Vaccine  Completed   Zoster Vaccines- Shingrix  Completed   HPV VACCINES  Aged Out   Meningococcal B Vaccine  Aged Out   Pneumococcal Vaccine: 50+ Years  Discontinued   Hepatitis C Screening  Discontinued    Health Maintenance Items Addressed: See Nurse Notes at the end of this note  Additional Screening:  Vision Screening: Recommended annual ophthalmology exams for early detection of glaucoma and other disorders of the eye. Is the patient up to date with their annual eye exam?  No  Who is the provider or what is the name of the office in which the patient attends annual eye exams? [facility]  Dental Screening: Recommended annual dental exams for proper oral hygiene  Community Resource Referral / Chronic Care Management: CRR required this visit?  No   CCM required this visit?  No   Plan:    I have personally reviewed and noted the following in the patient's chart:   Medical and social history Use of alcohol, tobacco or illicit drugs  Current medications and supplements including opioid prescriptions. Patient is not currently taking opioid prescriptions. Functional ability and status Nutritional status Physical activity Advanced directives List of other physicians Hospitalizations, surgeries, and ER visits in previous 12 months Vitals Screenings to include cognitive, depression, and falls Referrals and appointments  In addition, I have  reviewed and discussed with patient certain preventive protocols, quality metrics, and best practice recommendations. A written personalized care plan for  preventive services as well as general preventive health recommendations were provided to patient.   Tyreon Frigon L Jeronica Stlouis, CMA   07/10/2024   After Visit Summary: (MyChart) Due to this being a telephonic visit, the after visit summary with patients personalized plan was offered to patient via MyChart   Notes: Patient is up to date on all health maintenance with no concerns to address today.

## 2024-07-10 NOTE — Patient Instructions (Signed)
 Jeffrey Horn,  Thank you for taking the time for your Medicare Wellness Visit. I appreciate your continued commitment to your health goals. Please review the care plan we discussed, and feel free to reach out if I can assist you further.  Medicare recommends these wellness visits once per year to help you and your care team stay ahead of potential health issues. These visits are designed to focus on prevention, allowing your provider to concentrate on managing your acute and chronic conditions during your regular appointments.  Please note that Annual Wellness Visits do not include a physical exam. Some assessments may be limited, especially if the visit was conducted virtually. If needed, we may recommend a separate in-person follow-up with your provider.  Ongoing Care Seeing your primary care provider every 3 to 6 months helps us  monitor your health and provide consistent, personalized care. Last office visit on 05/11/2024.   Keep up the good work.  Referrals If a referral was made during today's visit and you haven't received any updates within two weeks, please contact the referred provider directly to check on the status.  Recommended Screenings:  Health Maintenance  Topic Date Due   COVID-19 Vaccine (7 - Mixed Product risk 2024-25 season) 06/18/2024   Medicare Annual Wellness Visit  07/10/2025   DTaP/Tdap/Td vaccine (4 - Td or Tdap) 05/20/2026   Flu Shot  Completed   Zoster (Shingles) Vaccine  Completed   HPV Vaccine  Aged Out   Meningitis B Vaccine  Aged Out   Pneumococcal Vaccine for age over 75  Discontinued   Hepatitis C Screening  Discontinued       07/10/2024    8:50 AM  Advanced Directives  Does Patient Have a Medical Advance Directive? Yes  Type of Estate agent of [place];Living will  Copy of Healthcare Power of Attorney in Chart? No - copy requested   Advance Care Planning is important because it: Ensures you receive medical care that aligns  with your values, goals, and preferences. Provides guidance to your family and loved ones, reducing the emotional burden of decision-making during critical moments.  Vision: Annual vision screenings are recommended for early detection of glaucoma, cataracts, and diabetic retinopathy. These exams can also reveal signs of chronic conditions such as diabetes and high blood pressure.  Dental: Annual dental screenings help detect early signs of oral cancer, gum disease, and other conditions linked to overall health, including heart disease and diabetes.  Please see the attached documents for additional preventive care recommendations.

## 2024-07-11 DIAGNOSIS — D1801 Hemangioma of skin and subcutaneous tissue: Secondary | ICD-10-CM | POA: Diagnosis not present

## 2024-07-11 DIAGNOSIS — L570 Actinic keratosis: Secondary | ICD-10-CM | POA: Diagnosis not present

## 2024-07-11 DIAGNOSIS — L812 Freckles: Secondary | ICD-10-CM | POA: Diagnosis not present

## 2024-07-11 DIAGNOSIS — L821 Other seborrheic keratosis: Secondary | ICD-10-CM | POA: Diagnosis not present

## 2024-07-11 DIAGNOSIS — Z85828 Personal history of other malignant neoplasm of skin: Secondary | ICD-10-CM | POA: Diagnosis not present

## 2024-08-23 DIAGNOSIS — C672 Malignant neoplasm of lateral wall of bladder: Secondary | ICD-10-CM | POA: Diagnosis not present

## 2025-05-13 ENCOUNTER — Encounter: Admitting: Family Medicine
